# Patient Record
Sex: Female | Born: 1970 | Race: White | Hispanic: No | Marital: Married | Smoking: Never smoker
Health system: Southern US, Community
[De-identification: ages and names within clinical notes are randomized; demographics above are authoritative.]

## PROBLEM LIST (undated history)

## (undated) DIAGNOSIS — D219 Benign neoplasm of connective and other soft tissue, unspecified: Secondary | ICD-10-CM

## (undated) DIAGNOSIS — N809 Endometriosis, unspecified: Secondary | ICD-10-CM

## (undated) DIAGNOSIS — R7303 Prediabetes: Secondary | ICD-10-CM

## (undated) DIAGNOSIS — N939 Abnormal uterine and vaginal bleeding, unspecified: Secondary | ICD-10-CM

## (undated) DIAGNOSIS — N879 Dysplasia of cervix uteri, unspecified: Secondary | ICD-10-CM

## (undated) DIAGNOSIS — R1032 Left lower quadrant pain: Secondary | ICD-10-CM

## (undated) DIAGNOSIS — J189 Pneumonia, unspecified organism: Secondary | ICD-10-CM

## (undated) DIAGNOSIS — D649 Anemia, unspecified: Secondary | ICD-10-CM

## (undated) DIAGNOSIS — N838 Other noninflammatory disorders of ovary, fallopian tube and broad ligament: Secondary | ICD-10-CM

## (undated) DIAGNOSIS — C439 Malignant melanoma of skin, unspecified: Secondary | ICD-10-CM

## (undated) HISTORY — DX: Malignant melanoma of skin, unspecified: C43.9

## (undated) HISTORY — DX: Endometriosis, unspecified: N80.9

## (undated) HISTORY — DX: Anemia, unspecified: D64.9

## (undated) HISTORY — PX: TONSILLECTOMY: SUR1361

## (undated) HISTORY — PX: LAPAROSCOPY: SHX197

## (undated) HISTORY — DX: Prediabetes: R73.03

## (undated) HISTORY — DX: Benign neoplasm of connective and other soft tissue, unspecified: D21.9

## (undated) HISTORY — DX: Other noninflammatory disorders of ovary, fallopian tube and broad ligament: N83.8

## (undated) HISTORY — DX: Abnormal uterine and vaginal bleeding, unspecified: N93.9

## (undated) HISTORY — DX: Dysplasia of cervix uteri, unspecified: N87.9

## (undated) HISTORY — PX: DILATION AND CURETTAGE OF UTERUS: SHX78

## (undated) HISTORY — PX: HYSTEROSCOPY: SHX211

## (undated) HISTORY — DX: Left lower quadrant pain: R10.32

---

## 2000-03-30 HISTORY — PX: LEEP: SHX91

## 2006-06-22 ENCOUNTER — Ambulatory Visit: Payer: Self-pay | Admitting: Obstetrics and Gynecology

## 2006-07-16 ENCOUNTER — Ambulatory Visit: Payer: Self-pay | Admitting: Obstetrics and Gynecology

## 2007-01-24 ENCOUNTER — Encounter: Payer: Self-pay | Admitting: Endocrinology

## 2007-01-24 ENCOUNTER — Ambulatory Visit: Payer: Self-pay | Admitting: Obstetrics and Gynecology

## 2007-01-26 ENCOUNTER — Encounter: Payer: Self-pay | Admitting: Endocrinology

## 2007-05-10 ENCOUNTER — Ambulatory Visit: Payer: Self-pay | Admitting: Endocrinology

## 2007-05-10 DIAGNOSIS — R5381 Other malaise: Secondary | ICD-10-CM | POA: Insufficient documentation

## 2007-05-10 DIAGNOSIS — R5383 Other fatigue: Secondary | ICD-10-CM

## 2008-01-30 ENCOUNTER — Ambulatory Visit: Payer: Self-pay | Admitting: Obstetrics and Gynecology

## 2008-06-15 ENCOUNTER — Ambulatory Visit: Payer: Self-pay | Admitting: Urology

## 2009-03-13 ENCOUNTER — Ambulatory Visit: Payer: Self-pay | Admitting: Obstetrics and Gynecology

## 2009-04-05 ENCOUNTER — Ambulatory Visit: Payer: Self-pay | Admitting: Urology

## 2010-04-14 ENCOUNTER — Ambulatory Visit: Payer: Self-pay | Admitting: Obstetrics and Gynecology

## 2011-06-02 ENCOUNTER — Ambulatory Visit: Payer: Self-pay | Admitting: Obstetrics and Gynecology

## 2012-08-04 ENCOUNTER — Ambulatory Visit: Payer: Self-pay | Admitting: Obstetrics and Gynecology

## 2013-09-19 ENCOUNTER — Ambulatory Visit: Payer: Self-pay | Admitting: Physician Assistant

## 2013-09-28 ENCOUNTER — Ambulatory Visit: Payer: Self-pay | Admitting: Obstetrics and Gynecology

## 2014-03-30 HISTORY — PX: NOVASURE ABLATION: SHX5394

## 2014-04-05 DIAGNOSIS — D509 Iron deficiency anemia, unspecified: Secondary | ICD-10-CM | POA: Insufficient documentation

## 2014-04-06 ENCOUNTER — Ambulatory Visit: Payer: Self-pay | Admitting: Gastroenterology

## 2014-04-10 ENCOUNTER — Ambulatory Visit: Payer: Self-pay | Admitting: Gastroenterology

## 2014-05-03 ENCOUNTER — Ambulatory Visit: Payer: Self-pay | Admitting: Obstetrics and Gynecology

## 2014-05-03 LAB — HEMOGLOBIN: HGB: 9.5 g/dL — ABNORMAL LOW (ref 12.0–16.0)

## 2014-05-14 ENCOUNTER — Ambulatory Visit: Payer: Self-pay | Admitting: Obstetrics and Gynecology

## 2014-07-23 LAB — SURGICAL PATHOLOGY

## 2014-07-29 NOTE — Op Note (Signed)
PATIENT NAME:  Tracy, Madden MR#:  [id number] DATE OF BIRTH:  December 30, 1970  DATE OF PROCEDURE:  05/14/2014  PREOPERATIVE DIAGNOSES: 1.  Abnormal uterine bleeding.  2.  Chronic endometritis, treated.   POSTOPERATIVE DIAGNOSES:  1.  Abnormal uterine bleeding.  2.  Chronic endometritis, treated.   OPERATIVE PROCEDURE:  1.  Hysteroscopy/dilation and curettage.  2.  NovaSure endometrial ablation.   SURGEON: Malachi Paradise, MD  FIRST ASSISTANT: Elita Boone, PA-S  ANESTHESIA: General.   INDICATIONS: The patient is a 44 year old multiparous white female who presents for surgical management of abnormal uterine bleeding. Preoperative endometrial biopsy demonstrated chronic endometritis which was treated with doxycycline. She is now here for ablation.   FINDINGS AT SURGERY: Revealed gynecoid pelvis and good uterine descensus to within 2 cm of the introitus. The patient is a good transvaginal hysterectomy candidate. On hysteroscopy, the cavity was normal in appearance.   DESCRIPTION OF PROCEDURE: The patient was brought to the operating room where she was placed in the supine position. General endotracheal anesthesia was induced without difficulty. She was placed in the dorsal lithotomy position using the candy-cane stirrups. A Betadine perineal, intravaginal prep and drape was performed in standard fashion. A latex free catheter was used to drain 100 mL of urine from the bladder. A single-tooth tenaculum was placed on the anterior lip of the cervix. The uterus was sounded to 10 cm. The endocervical length was measured at 4 cm. Hegar dilators were used up to a #8 [place] in order to dilate the endocervical canal. This was followed by hysteroscopy using lactated Ringer's as irrigant to assess the intrauterine cavity. Normal findings were identified. No significant lesions were seen. Both smooth and serrated curettes were used to curettage the endometrial cavity. Repeat hysteroscopy demonstrated  no significant residual tissue. Following completion of the D and C, NovaSure ablation was performed in standard fashion. The instrument was inserted and secured. The uterine width was 4.3 cm. Cavity test was negative. This was followed by engaging the instrument with subsequent ablation being performed. Once the procedure was completed, the instrument was removed from the uterine cavity. Repeat hysteroscopy demonstrated an excellent thermal effect of the endometrial cavity. All instrumentation was then removed from the vagina and uterus. The patient was awakened, mobilized, and taken to the recovery room in satisfactory condition.   ESTIMATED BLOOD LOSS: 25 mL.   IV FLUIDS: 1,000 mL.   URINE OUTPUT: 100 mL.  COUNTS: All instrument, needle and sponge counts were verified as correct.  ____________________________ Alanda Slim. Delvon Chipps, MD mad:sb D: 05/14/2014 13:35:24 ET T: 05/14/2014 14:37:49 ET JOB#: [id number]  cc: Hassell Done A. Jacquelynne Guedes, MD, <Dictator> Alanda Slim Piedad Standiford MD ELECTRONICALLY SIGNED 05/30/2014 13:42

## 2014-08-29 ENCOUNTER — Telehealth: Payer: Self-pay | Admitting: Obstetrics and Gynecology

## 2014-08-29 NOTE — Telephone Encounter (Signed)
PT CALLED AND WAS RETURNING YOUR CALL SHE WOULD LIKE FOR YOU TO CALL HER BACK AT [phone].Marland KitchenTHANKS TINA

## 2014-08-29 NOTE — Telephone Encounter (Signed)
Left message to contact office

## 2014-09-04 MED ORDER — FLUCONAZOLE 150 MG PO TABS: 150.0000 mg | ORAL_TABLET | Freq: Once | ORAL | Status: DC

## 2014-09-04 NOTE — Telephone Encounter (Signed)
Pt. Aware of nu-swab results and medication (Diflucan) called in.

## 2014-10-16 ENCOUNTER — Other Ambulatory Visit: Payer: Self-pay

## 2014-10-16 DIAGNOSIS — R1032 Left lower quadrant pain: Secondary | ICD-10-CM

## 2014-10-17 ENCOUNTER — Ambulatory Visit: Payer: Self-pay | Admitting: Obstetrics and Gynecology

## 2014-10-17 ENCOUNTER — Ambulatory Visit (INDEPENDENT_AMBULATORY_CARE_PROVIDER_SITE_OTHER): Payer: 59 | Admitting: Obstetrics and Gynecology

## 2014-10-17 ENCOUNTER — Ambulatory Visit: Payer: 59

## 2014-10-17 ENCOUNTER — Encounter: Payer: Self-pay | Admitting: Obstetrics and Gynecology

## 2014-10-17 VITALS — BP 110/75 | HR 65 | Ht 63.0 in | Wt 132.4 lb

## 2014-10-17 DIAGNOSIS — R102 Pelvic and perineal pain unspecified side: Secondary | ICD-10-CM

## 2014-10-17 DIAGNOSIS — N879 Dysplasia of cervix uteri, unspecified: Secondary | ICD-10-CM | POA: Insufficient documentation

## 2014-10-17 DIAGNOSIS — N809 Endometriosis, unspecified: Secondary | ICD-10-CM | POA: Diagnosis not present

## 2014-10-17 DIAGNOSIS — N941 Dyspareunia: Secondary | ICD-10-CM | POA: Diagnosis not present

## 2014-10-17 DIAGNOSIS — R87619 Unspecified abnormal cytological findings in specimens from cervix uteri: Secondary | ICD-10-CM | POA: Insufficient documentation

## 2014-10-17 DIAGNOSIS — R1032 Left lower quadrant pain: Secondary | ICD-10-CM

## 2014-10-17 DIAGNOSIS — N898 Other specified noninflammatory disorders of vagina: Secondary | ICD-10-CM | POA: Diagnosis not present

## 2014-10-17 DIAGNOSIS — IMO0002 Reserved for concepts with insufficient information to code with codable children: Secondary | ICD-10-CM

## 2014-10-17 NOTE — Patient Instructions (Signed)
1.  Patient will be notified by phone of laboratory results and further management planning

## 2014-10-17 NOTE — Progress Notes (Signed)
Patient ID: Tracy Madden, female   DOB: [birth year], 44 y.o.   MRN: [id number]   2 MONTH F/U O LEFT OVARIAN CYST- SEE U/S REPORT PT IS STILL HAVING YELLOW D/C  LLQ PAIN AFTER IC- NAGGING PAIN  Chief complaint: 1.  Left lower quadrant pain. 2.  Chronic yellow vaginal discharge. 3.  History of recurrent monilial vaginitis  Patient presents today for follow-up on pelvic ultrasound  and evaluation of vaginal discharge that appears to be chronic.  This is associated with pelvic discomfort following intercourse.  She has treated herself with multiple antifungal medications, including intravaginal and oral preparations.  She is unable to clear the discharge.  Ultrasound today for evaluation of left lower quadrant pain, was notable for normal-appearing ovaries, 2 small fibroids measuring less than 2 cm, and then endometrial stripe with fluid within the endometrial cavity.  Patient is not experiencing any abnormal uterine bleeding. She does have a history of endometriosis.  OBJECTIVE: BP 110/75 mmHg  Pulse 65  Ht 5\' 3"  (1.6 m)  Wt 132 lb 6.4 oz (60.056 kg)  BMI 23.46 kg/m2 Patient is a pleasant, well-appearing white female in no acute distress. Abdomen soft, nontender, without organomegaly. Flank without CVA tenderness. Pelvic:  Vulva-normal.  , BUS-normal.  Vagina-white discharge, flaky, no odor   Cervix-normal  Uterus-normal  Adnexa-nontender, nonpalpable  Rectum-normal  PROCEDURE: Wet prep: Normal saline-negative. KOH prep-negative  IMPRESSION: 1.  Chronic left lower quadrant pain, unclear etiology, nonacute. 2.  Normal adnexa on pelvic ultrasound. 3.  Small uterine fibroids. 4.  Possible monilia vaginitis, recurrent.  PLAN: 1.  Wet prep done. 2.   Nu swab done due to inconclusive nature of wet prep 3.  Patient will wait for laboratory results before further management. 4.  Will consider weekly Diflucan 8-12 weeks .  If laboratory testing is positive for  monilia.

## 2014-10-22 LAB — NUSWAB BV AND CANDIDA, NAA
CANDIDA GLABRATA, NAA: NEGATIVE
Candida albicans, NAA: NEGATIVE

## 2014-11-06 ENCOUNTER — Telehealth: Payer: Self-pay | Admitting: Obstetrics and Gynecology

## 2014-11-06 NOTE — Telephone Encounter (Signed)
Pt called and would like a call back, she had some questions.

## 2014-11-07 ENCOUNTER — Telehealth: Payer: Self-pay | Admitting: Obstetrics and Gynecology

## 2014-11-07 MED ORDER — FLUCONAZOLE 150 MG PO TABS: 150.0000 mg | ORAL_TABLET | ORAL | Status: DC

## 2014-11-07 NOTE — Addendum Note (Signed)
Addended by: Elouise Munroe on: 11/07/2014 11:28 AM   Modules accepted: Orders

## 2014-11-07 NOTE — Telephone Encounter (Signed)
Pt would like weekly diflucan for yeast infections. Pt has tried otc which is not helping. Per mad note she may try diflucan weekly for 12 weeks. Erx. Pt advised if no better in 12 weeks she will need to be seen.

## 2014-11-07 NOTE — Telephone Encounter (Signed)
Pt called this am. See message 11/07/2014.

## 2014-11-07 NOTE — Telephone Encounter (Signed)
SHE DISCUSSED A MEDICATION WITH DR DE AT LAST VISIT AND WOULD LIKE THAT MED SENT TO PHARMACY   DIFLUCAN   [place]

## 2014-12-27 ENCOUNTER — Encounter: Payer: Self-pay | Admitting: Family

## 2014-12-27 ENCOUNTER — Ambulatory Visit: Payer: Self-pay | Admitting: Family

## 2014-12-27 VITALS — BP 92/60 | Temp 98.7°F

## 2014-12-27 DIAGNOSIS — K120 Recurrent oral aphthae: Secondary | ICD-10-CM

## 2014-12-27 NOTE — Progress Notes (Signed)
S/ 44 y/o WMF c/o painful lesion on lower lip x 6 d , mild uri sxs , denies dental pain, + stress in grad school ROS otherwise neg  O/ VSS NAD       ENT unremarkable x canker sore lower lip        Neck supple         Heart RR Lungs clear A/ canker sore  P / oragel. Saline gargles. Tylenol 1-2 tabs po q4h prn Reassurance. F/u prn.

## 2015-05-30 ENCOUNTER — Ambulatory Visit: Payer: 59 | Admitting: Obstetrics and Gynecology

## 2015-05-31 ENCOUNTER — Encounter: Payer: Self-pay | Admitting: Physician Assistant

## 2015-05-31 ENCOUNTER — Ambulatory Visit: Payer: Self-pay | Admitting: Physician Assistant

## 2015-05-31 VITALS — BP 100/72 | HR 72 | Temp 98.7°F

## 2015-05-31 DIAGNOSIS — J209 Acute bronchitis, unspecified: Secondary | ICD-10-CM

## 2015-05-31 DIAGNOSIS — B009 Herpesviral infection, unspecified: Secondary | ICD-10-CM

## 2015-05-31 MED ORDER — PSEUDOEPH-BROMPHEN-DM 30-2-10 MG/5ML PO SYRP: 5.0000 mL | ORAL_SOLUTION | Freq: Four times a day (QID) | ORAL | Status: DC | PRN

## 2015-05-31 MED ORDER — PENCICLOVIR 1 % EX CREA: 1.0000 "application " | TOPICAL_CREAM | CUTANEOUS | Status: DC

## 2015-05-31 MED ORDER — ACYCLOVIR 400 MG PO TABS: 400.0000 mg | ORAL_TABLET | Freq: Every day | ORAL | Status: DC

## 2015-05-31 NOTE — Progress Notes (Signed)
   Subjective:    Patient ID: Tracy Madden, female    DOB: 1970-10-29, 45 y.o.   MRN: [id number]  HPI Patient c/o nasal and chest congestion. States productive cough. Patient has vesicular lesion on upper lip. Patient denies fever/chill, or N/V/D. States using OTC medication for cough.   Review of Systems Fatigue and anemia    Objective:   Physical Exam Vesicular lesion upper lip, edematous nasal turbinates, post nasal drainage, non-productive cough.  Neck supple, Lungs with mild Rales, Heart RRR.       Assessment & Plan: Herpes Simplex and Bronchitis.  Acyclovir, Bromfed DM, and Acyclovir.  Follow up PRN

## 2015-06-12 ENCOUNTER — Ambulatory Visit (INDEPENDENT_AMBULATORY_CARE_PROVIDER_SITE_OTHER): Payer: 59 | Admitting: Obstetrics and Gynecology

## 2015-06-12 ENCOUNTER — Encounter: Payer: Self-pay | Admitting: Obstetrics and Gynecology

## 2015-06-12 VITALS — BP 101/63 | HR 74 | Ht 63.0 in | Wt 134.0 lb

## 2015-06-12 DIAGNOSIS — Z1239 Encounter for other screening for malignant neoplasm of breast: Secondary | ICD-10-CM | POA: Diagnosis not present

## 2015-06-12 DIAGNOSIS — N898 Other specified noninflammatory disorders of vagina: Secondary | ICD-10-CM

## 2015-06-12 DIAGNOSIS — R102 Pelvic and perineal pain: Secondary | ICD-10-CM

## 2015-06-12 DIAGNOSIS — Z124 Encounter for screening for malignant neoplasm of cervix: Secondary | ICD-10-CM | POA: Diagnosis not present

## 2015-06-12 LAB — POCT URINALYSIS DIPSTICK
Bilirubin, UA: NEGATIVE
Glucose, UA: NEGATIVE
Ketones, UA: NEGATIVE
LEUKOCYTES UA: NEGATIVE
NITRITE UA: NEGATIVE
PROTEIN UA: NEGATIVE
Spec Grav, UA: 1.025
UROBILINOGEN UA: 0.2
pH, UA: 5

## 2015-06-12 NOTE — Progress Notes (Signed)
GYN ENCOUNTER NOTE  Subjective:       Tracy Madden is a 45 y.o. G4P1  female is here for gynecologic evaluation of the following issues:  1. Lower abdominal pain 2. Watery vaginal discharge   Gynecologic History No LMP recorded. Patient has had an ablation. Contraception: surgical Last Pap: 2015. Results were: normal Last mammogram: 2015. Results were: normal  Obstetric History OB History  No data available    Past Medical History  Diagnosis Date  . Endometriosis   . Fibroid   . Abnormal uterine bleeding   . Abdominal pain, LLQ   . Paratubal cyst   . Anemia   . Prediabetes   . Cervical dysplasia   . Melanoma ([place])     RT SHOULDER    Past Surgical History  Procedure Laterality Date  . Leep  2002  . Laparoscopy      DRAINAGE AND EXCISION OF PARATUBAL CYST  . Hysteroscopy    . Dilation and curettage of uterus    . Novasure ablation  2016    Current Outpatient Prescriptions on File Prior to Visit  Medication Sig Dispense Refill  . clindamycin (CLINDAGEL) 1 % gel Apply topically.    Marland Kitchen VALERIAN ROOT PO Take by mouth.     No current facility-administered medications on file prior to visit.    Allergies  Allergen Reactions  . Aspirin   . Niacin Nausea And Vomiting  . Eggs Or Egg-Derived Products Rash    Social History   Social History  . Marital Status: Married    Spouse Name: N/A  . Number of Children: N/A  . Years of Education: N/A   Occupational History  . Not on file.   Social History Main Topics  . Smoking status: Never Smoker   . Smokeless tobacco: Not on file  . Alcohol Use: Yes     Comment: OCCAS  . Drug Use: No  . Sexual Activity: Yes    Birth Control/ Protection: Surgical   Other Topics Concern  . Not on file   Social History Narrative    Family History  Problem Relation Age of Onset  . Cancer Neg Hx     The following portions of the patient's history were reviewed and updated as appropriate: allergies, current  medications, past family history, past medical history, past social history, past surgical history and problem list.  Review of Systems Review of Systems - General ROS: negative for - chills, fatigue, fever, hot flashes, malaise or night sweats Hematological and Lymphatic ROS: negative for - bleeding problems or swollen lymph nodes Gastrointestinal ROS: negative for - abdominal pain, blood in stools, change in bowel habits and nausea/vomiting Musculoskeletal ROS: negative for - joint pain, muscle pain or muscular weakness Genito-Urinary ROS: negative for - change in menstrual cycle, dysmenorrhea, dyspareunia, dysuria,  genital ulcers, hematuria, incontinence, irregular/heavy menses, nocturia. Positive for occasional vaginal watery discharge, occasional sharp lower abdominal pain, mild lower back pain  Objective:   BP 101/63 mmHg  Pulse 74  Ht 5\' 3"  (1.6 m)  Wt 134 lb (60.782 kg)  BMI 23.74 kg/m2 CONSTITUTIONAL: Well-developed, well-nourished female in no acute distress.  HENT:  Normocephalic, atraumatic.  NECK: Normal range of motion, supple, no masses.  Normal thyroid.  SKIN: Skin is warm and dry. No rash noted. Not diaphoretic. No erythema. No pallor. [place]: Alert and oriented to person, place, and time. PSYCHIATRIC: Normal mood and affect. Normal behavior. Normal judgment and thought content. CARDIOVASCULAR:Not Examined RESPIRATORY: Not Examined BREASTS:  Not Examined ABDOMEN: Soft, non distended; Non tender.  No Organomegaly. PELVIC:  External Genitalia: Normal  BUS: Normal  Vagina: Normal; Normal-appearing secretions  Cervix: Normal; No cervical motion tenderness  Uterus: Normal size, shape,consistency, mobile. Mild pain on bimanual exam  Adnexa: Normal; mild left-sided pain on palpation; No masses  RV: Normal External exam  Bladder: Nontender MUSCULOSKELETAL: Normal range of motion. No tenderness.  No cyanosis, clubbing, or edema.  PROCEDURE: Wet  prep. KOH-negative. Normal saline-negative     Assessment:   1. Pelvic pain in female - POCT urinalysis dipstick - Urine culture 2. Screening for breast cancer - MM DIGITAL SCREENING BILATERAL; Future 3. Vaginal discharge - Wet prep: DNA; gonorrhea/chlamydia, trichomoniasis, candidiasis, bacterial vaginosis - Pap smear   Plan:   Pelvic pain likely due to established endometriosis of uterus  Discussed Lupron as treatment option, patient will contact if she decides to try it.  She will record episodes of pain and discharge on monthly calendar to bring to next appointment Return for follow-up in 6 months.    Olene Floss, PA-S [name], MD   I have seen, interviewed, and examined the patient in conjunction with the [facility].A. student and affirm the diagnosis and management plan. Toinette Lackie A. Brandace Cargle, MD, FACOG   Note: This dictation was prepared with Dragon dictation along with smaller phrase technology. Any transcriptional errors that result from this process are unintentional.

## 2015-06-14 LAB — URINE CULTURE: Organism ID, Bacteria: NO GROWTH

## 2015-06-15 LAB — NUSWAB VAGINITIS PLUS (VG+)
Candida albicans, NAA: NEGATIVE
Candida glabrata, NAA: NEGATIVE
Chlamydia trachomatis, NAA: NEGATIVE
Neisseria gonorrhoeae, NAA: NEGATIVE
Trich vag by NAA: NEGATIVE

## 2015-06-17 LAB — PAP IG AND HPV HIGH-RISK
HPV, HIGH-RISK: POSITIVE — AB
PAP Smear Comment: 0

## 2015-07-01 NOTE — Patient Instructions (Addendum)
1.  Urine culture is sent. 2.  Wet prep is completed. 3.  Return as needed if symptoms worsen. 4.  Follow-up in 6 months

## 2015-07-02 ENCOUNTER — Ambulatory Visit (INDEPENDENT_AMBULATORY_CARE_PROVIDER_SITE_OTHER): Payer: 59 | Admitting: Obstetrics and Gynecology

## 2015-07-02 ENCOUNTER — Encounter: Payer: Self-pay | Admitting: Obstetrics and Gynecology

## 2015-07-02 VITALS — BP 122/69 | HR 77 | Ht 63.0 in | Wt 133.0 lb

## 2015-07-02 DIAGNOSIS — B977 Papillomavirus as the cause of diseases classified elsewhere: Secondary | ICD-10-CM | POA: Diagnosis not present

## 2015-07-02 DIAGNOSIS — N870 Mild cervical dysplasia: Secondary | ICD-10-CM | POA: Diagnosis not present

## 2015-07-02 DIAGNOSIS — R888 Abnormal findings in other body fluids and substances: Secondary | ICD-10-CM

## 2015-07-02 DIAGNOSIS — IMO0002 Reserved for concepts with insufficient information to code with codable children: Secondary | ICD-10-CM

## 2015-07-02 NOTE — Patient Instructions (Signed)
COLPOSCOPY POST-PROCEDURE INSTRUCTIONS  1. You may take Ibuprofen, Aleve or Tylenol for cramping if needed.  2. If Monsel's solution was used, you will have a black discharge.  3. Light bleeding is normal.  If bleeding is heavier than your period, please call.  4. Put nothing in your vagina until the bleeding or discharge stops (usually 2 or3 days).  5. We will call you within one week with biopsy results or discuss the results at your follow-up appointment if needed. 6.  

## 2015-07-02 NOTE — Addendum Note (Signed)
Addended by: Elouise Munroe on: 07/02/2015 04:12 PM   Modules accepted: Orders

## 2015-07-02 NOTE — Progress Notes (Signed)
Chief complaint: 1. Positive HPV Pap smear 2. Colposcopy  Past history of cervical dysplasia Status post LEEP cone biopsy in 2002 Past history of hysteroscopy/D&C with NovaSure ablation 2016  OBJECTIVE: BP 122/69 mmHg  Pulse 77  Ht 5\' 3"  (1.6 m)  Wt 133 lb (60.328 kg)  BMI 23.57 kg/m2  COLPOSCOPY: Upper adjacent vagina and cervix with biopsies and ECC SCJ: Not visualized; cervix appears slightly windswept Lesions: Possible abnormal vessels at 3:00 Biopsies: ECC; cervical biopsy 3:00 (tenaculum was used to help obtain biopsy)  ASSESSMENT: 1. Positive HPV on Pap 2. Inadequate colposcopy 3. Possible abnormal vessel at 3  PLAN: 1. ECC 2. Cervix biopsy 3:00 3. Will notify patient of results by telephone 4. Return in 6 months for colposcopy  [name], MD  Note: This dictation was prepared with Dragon dictation along with smaller phrase technology. Any transcriptional errors that result from this process are unintentional.

## 2015-07-04 LAB — PATHOLOGY

## 2015-07-09 ENCOUNTER — Ambulatory Visit: Payer: 59 | Admitting: Obstetrics and Gynecology

## 2015-07-15 ENCOUNTER — Encounter: Payer: Self-pay | Admitting: Obstetrics and Gynecology

## 2015-09-19 DIAGNOSIS — D485 Neoplasm of uncertain behavior of skin: Secondary | ICD-10-CM | POA: Diagnosis not present

## 2015-10-17 ENCOUNTER — Telehealth: Payer: Self-pay | Admitting: Obstetrics and Gynecology

## 2015-10-17 NOTE — Telephone Encounter (Signed)
PT CALLED AND IS HAVING LEFT NIPPLE PAIN, SHE IS UNSURE IT ITS DUE TO THE HOT WEATHER OR SOMETHING ELSE, I TOLD HER THAT DR DES NEXT AVAILABLE APPT IS 8/17, SHE WENT AHEAD AND MADE AN APPT FOR THAT DAY BUT SHE IS CONCERNED AND WANTED TO KNOW IF DR DE COULD ORDER A MAMMO WITH OUT SEEING HER OR SHE SAID A HORMONE BLOOD TEST.

## 2015-10-17 NOTE — Telephone Encounter (Signed)
Pt states for the last 2 months she has constant left nipple/areola pain. NO fever or redness. NO trauma to the breast. She has tried ice and atb ointment. Nothing helps. Mammo was ordered 05/2015. Advised pt she could have mammo but may want MAD to examine first. Has an appt on 8/17. Pt also wants her hormone levels checked. Aware I will send message to mad.

## 2015-10-21 NOTE — Telephone Encounter (Signed)
Pt will have mammogram first. Pain not as bad recently. She will f/u with Mad on 8/17 for exam. At that time she will discuss referral for gen surgery. Will call me back if desires referral before then.

## 2015-11-14 ENCOUNTER — Ambulatory Visit: Payer: 59 | Admitting: Obstetrics and Gynecology

## 2015-12-06 ENCOUNTER — Ambulatory Visit: Payer: Self-pay | Admitting: Physician Assistant

## 2015-12-16 ENCOUNTER — Other Ambulatory Visit: Payer: Self-pay | Admitting: Obstetrics and Gynecology

## 2015-12-25 ENCOUNTER — Ambulatory Visit: Payer: Self-pay

## 2016-01-01 ENCOUNTER — Ambulatory Visit: Payer: 59 | Admitting: Obstetrics and Gynecology

## 2016-01-09 ENCOUNTER — Encounter: Payer: Self-pay | Admitting: Obstetrics and Gynecology

## 2016-01-09 ENCOUNTER — Other Ambulatory Visit: Payer: Self-pay | Admitting: Obstetrics and Gynecology

## 2016-01-09 ENCOUNTER — Ambulatory Visit (INDEPENDENT_AMBULATORY_CARE_PROVIDER_SITE_OTHER): Payer: 59 | Admitting: Obstetrics and Gynecology

## 2016-01-09 ENCOUNTER — Ambulatory Visit
Admission: RE | Admit: 2016-01-09 | Discharge: 2016-01-09 | Disposition: A | Discharge status: Home / Self Care | Payer: 59 | Source: Ambulatory Visit | Attending: Obstetrics and Gynecology | Admitting: Obstetrics and Gynecology

## 2016-01-09 VITALS — BP 118/68 | HR 72 | Ht 63.0 in | Wt 137.3 lb

## 2016-01-09 DIAGNOSIS — B977 Papillomavirus as the cause of diseases classified elsewhere: Secondary | ICD-10-CM | POA: Diagnosis not present

## 2016-01-09 DIAGNOSIS — Z1239 Encounter for other screening for malignant neoplasm of breast: Secondary | ICD-10-CM

## 2016-01-09 DIAGNOSIS — N870 Mild cervical dysplasia: Secondary | ICD-10-CM

## 2016-01-09 DIAGNOSIS — Z1231 Encounter for screening mammogram for malignant neoplasm of breast: Secondary | ICD-10-CM | POA: Insufficient documentation

## 2016-01-09 MED ORDER — NITROFURANTOIN MONOHYD MACRO 100 MG PO CAPS: 100.0000 mg | ORAL_CAPSULE | Freq: Every day | ORAL | 6 refills | Status: DC | PRN

## 2016-01-09 MED ORDER — CLINDAMYCIN PHOSPHATE 1 % EX GEL: 1.0000 "application " | Freq: Two times a day (BID) | CUTANEOUS | 3 refills | Status: DC

## 2016-01-09 NOTE — Patient Instructions (Signed)
1. Return in November for colposcopy 2. LEEP cone biopsy is scheduled for December 18  Loop Electrosurgical Excision Procedure Loop electrosurgical excision procedure (LEEP) is the removal of a portion of the lower part of the uterus (cervix). The procedure is done when there are significantly abnormal cervical cell changes. Abnormal cell changes of the cervix can lead to cancer if left in place and untreated.  The LEEP procedure itself typically only takes a few minutes. Often, it may be done in your caregiver's office. The procedure is considered safe for those who wish to get pregnant or are trying to get pregnant. Only under rare circumstances should this procedure be done if you are pregnant. LET YOUR CAREGIVER KNOW ABOUT:  Whether you are pregnant or late for your last menstrual period.  Allergies to foods or medicines.  All the medicines you are taking includingherbs, eyedrops, and over-the-counter medicines, and creams.  Use of steroids (by mouth or creams).  Previous problems with anesthetics or numbing medicine.  Previous gynecological surgery.  History of blood clots or bleeding problems.  Any recent or current vaginal infections (herpes, sexually transmitted infections).  Other health problems. RISKS AND COMPLICATIONS  Bleeding.  Infection.  Injury to the vagina, bladder, or rectum.  Very rare obstruction of the cervical opening that causes problems during menstruation (cervical stenosis). BEFORE THE PROCEDURE  Do not take aspirin or blood thinners (anticoagulants) for 1 week before the procedure, or as told by your caregiver.  Eat a light meal before the procedure.  Ask your caregiver about changing or stopping your regular medicines.  You may be given a pain reliever 1 or 2 hours before the procedure. PROCEDURE   A tool (speculum) is placed in the vagina. This allows your caregiver to see the cervix.  An iodine stain is applied to the cervix to find the  area of abnormal cells to be removed.  Medicine is injected to numb the cervix (local anesthetic).   Electricity is passed through a thin wire loop which is then used to remove (cauterize) a small segment of the affected cervix.  Light electrocautery is used to seal any small blood vessels and prevent bleeding.  A paste may be applied to the cauterized area of the cervix to help prevent bleeding.  The tissue sample is sent to the lab. It is examined under the microscope. AFTER THE PROCEDURE  Have someone drive you home.  You may have slight to moderate cramping.  You may notice a black vaginal discharge from the paste used on the cervix to prevent bleeding. This is normal.  Watch for excessive bleeding. This requires immediate medical care.  Ask when your test results will be ready. Make sure you get your test results.   This information is not intended to replace advice given to you by your health care provider. Make sure you discuss any questions you have with your health care provider.   Document Released: 06/06/2002 Document Revised: 06/08/2011 Document Reviewed: 08/26/2010 Elsevier Interactive Patient Education [employer].

## 2016-01-09 NOTE — Progress Notes (Signed)
Chief complaint: 1. 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 Pros and cons of this aggressive management plan were reviewed.  Past medical history, past surgical history, problem list, medications, and allergies are reviewed  OBJECTIVE: BP 118/68   Pulse 72   Ht 5\' 3"  (1.6 m)   Wt 137 lb 4.8 oz (62.3 kg)   BMI 24.32 kg/m  Physical exam-deferred  ASSESSMENT: 1. 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 Desires surgical intervention  PLAN: 1. Colposcopy mid-November 2017 2. LEEP cone biopsy of cervix minutes December 2017  A total of 15 minutes were spent face-to-face with the patient during this encounter and over half of that time dealt with counseling and coordination of care.  [name], MD  Note: This dictation was prepared with Dragon dictation along with smaller phrase technology. Any transcriptional errors that result from this process are unintentional.

## 2016-01-22 DIAGNOSIS — H5213 Myopia, bilateral: Secondary | ICD-10-CM | POA: Diagnosis not present

## 2016-02-11 ENCOUNTER — Encounter: Payer: Self-pay | Admitting: Obstetrics and Gynecology

## 2016-02-11 ENCOUNTER — Ambulatory Visit (INDEPENDENT_AMBULATORY_CARE_PROVIDER_SITE_OTHER): Payer: 59 | Admitting: Obstetrics and Gynecology

## 2016-02-11 VITALS — BP 103/68 | HR 64 | Ht 63.0 in | Wt 139.7 lb

## 2016-02-11 DIAGNOSIS — N870 Mild cervical dysplasia: Secondary | ICD-10-CM | POA: Diagnosis not present

## 2016-02-11 DIAGNOSIS — N9089 Other specified noninflammatory disorders of vulva and perineum: Secondary | ICD-10-CM | POA: Diagnosis not present

## 2016-02-11 NOTE — Progress Notes (Addendum)
Chief complaint: 1. History of CIN-1 of cervix 2. Colposcopy   Past history of cervical dysplasia Status post LEEP cone biopsy in 2002 Past history of hysteroscopy/D&C with NovaSure ablation 2016 07/02/2015 colposcopy inadequate; abnormal vessel at 3:00; biopsy consistent with CIN-1    OBJECTIVE: BP 103/68   Pulse 64   Ht 5\' 3"  (1.6 m)   Wt 139 lb 11.2 oz (63.4 kg)   BMI 24.75 kg/m   PROCEDURE: Colposcopy of cervix and upper vagina  Indications: History of CIN-1 of the cervix; status post LEEP cone biopsy  Findings: Inadequate colposcopy (SCJ not visualized); 4 mm circular blood vessel at 9:00; cervix with wind swept change  ASSESSMENT: 1. History of CIN-1 2. Patient desires definitive surgery for this chronic condition 3. Inadequate colposcopy today with evidence of an abnormal vessel at 9:00-4 mm circular blood vessel 4. History of LEEP cone biopsy 2002  PLAN: 1. LEEP cone biopsy of cervix in mid December 2017 2. Return for preoperative appointment prior surgery  [name], MD  Note: This dictation was prepared with Dragon dictation along with smaller phrase technology. Any transcriptional errors that result from this process are unintentional.   ADDENDUM: Patient has left labia majora raised lesion 4 mm, subtle, desires removal Will proceed with punch biopsy of left labia majora lesion at the time of LEEP cone biopsy.

## 2016-02-11 NOTE — Patient Instructions (Signed)
1. LEEP cone biopsy of the cervix was scheduled for mid December 2017 2. Return for preop appointment 1 week before surgery

## 2016-02-17 ENCOUNTER — Encounter: Payer: Self-pay | Admitting: Obstetrics and Gynecology

## 2016-03-11 ENCOUNTER — Ambulatory Visit (INDEPENDENT_AMBULATORY_CARE_PROVIDER_SITE_OTHER): Payer: 59 | Admitting: Obstetrics and Gynecology

## 2016-03-11 ENCOUNTER — Encounter: Payer: Self-pay | Admitting: Obstetrics and Gynecology

## 2016-03-11 VITALS — BP 112/71 | HR 89 | Ht 63.0 in | Wt 138.6 lb

## 2016-03-11 DIAGNOSIS — N870 Mild cervical dysplasia: Secondary | ICD-10-CM

## 2016-03-11 DIAGNOSIS — N9089 Other specified noninflammatory disorders of vulva and perineum: Secondary | ICD-10-CM

## 2016-03-11 DIAGNOSIS — Z01818 Encounter for other preprocedural examination: Secondary | ICD-10-CM

## 2016-03-11 NOTE — Patient Instructions (Signed)
1. Return on 04/08/2016 for postop check

## 2016-03-11 NOTE — H&P (Signed)
Subjective:  PREOPERATIVE HISTORY AND PHYSICAL    Date of surgery: 03/16/2016 Procedure: LEEP cone biopsy of cervix; vulvar biopsy Diagnoses: 1. CIN-1 of cervix 2. Vulvar lesion   Patient is a 45 y.o. No obstetric history on file.female scheduled for LEEP cone blot biopsy of the cervix and vulvar biopsy of atypical lesion. History of cervical dysplasia in past. Status post LEEP cone biopsy in 2002 Status post hysteroscopy/D&C with NovaSure endometrial ablation in 2016 07/02/2015 colposcopy was inadequate; abnormal vessel at 3:00; biopsy consistent with CIN-1 02/11/2016 colposcopy was inadequate; 4 mm circular blood vessel at 9:00 noted, cervix with wind swept change. Patient has left labia majora lesion, chronic for which biopsies desired. 3.5 mm punch biopsy will be performed.  Menstrual History: OB History    No data available     No LMP recorded. Patient has had an ablation.    Past Medical History:  Diagnosis Date  . Abdominal pain, LLQ   . Abnormal uterine bleeding   . Anemia   . Cervical dysplasia   . Endometriosis   . Fibroid   . Melanoma ([place])    RT SHOULDER  . Paratubal cyst   . Prediabetes     Past Surgical History:  Procedure Laterality Date  . DILATION AND CURETTAGE OF UTERUS    . HYSTEROSCOPY    . LAPAROSCOPY     DRAINAGE AND EXCISION OF PARATUBAL CYST  . LEEP  2002  . NOVASURE ABLATION  2016    OB History  No data available    Social History   Social History  . Marital status: Married    Spouse name: N/A  . Number of children: N/A  . Years of education: N/A   Social History Main Topics  . Smoking status: Never Smoker  . Smokeless tobacco: Never Used  . Alcohol use Yes     Comment: OCCAS  . Drug use: No  . Sexual activity: Yes    Birth control/ protection: Surgical   Other Topics Concern  . None   Social History Narrative  . None    Family History  Problem Relation Age of Onset  . Cancer Neg Hx      (Not in a hospital  admission)  Allergies  Allergen Reactions  . Aspirin Shortness Of Breath    Inhale aspirin, no reaction to oral aspirin   . Eggs Or Egg-Derived Products Anaphylaxis  . Niacin Hives and Rash    Review of Systems Constitutional: No recent fever/chills/sweats Respiratory: No recent cough/bronchitis Cardiovascular: No chest pain Gastrointestinal: No recent nausea/vomiting/diarrhea Genitourinary: No UTI symptoms Hematologic/lymphatic:No history of coagulopathy or recent blood thinner use    Objective:    BP 112/71   Pulse 89   Ht 5\' 3"  (1.6 m)   Wt 138 lb 9.6 oz (62.9 kg)   BMI 24.55 kg/m   General:   Normal  Skin:   normal  HEENT:  Normal  Neck:  Supple without Adenopathy or Thyromegaly  Lungs:   Heart:              Breasts:   Abdomen:  Pelvis:  M/S   Extremeties:  Neuro:    clear to auscultation bilaterally   Normal without murmur   Not Examined   soft, non-tender; bowel sounds normal; no masses,  no organomegaly   Exam deferred to OR  No CVAT  Warm/Dry   Normal          Assessment:    1. Chronic persistent cervical dysplasia-CIN-1  2. History of prior LEEP cone biopsy 2002 3. Vulvar lesion left labia majora   Plan:  LEEP cone biopsy of the cervix Vulvar biopsy-3.5 mm punch  Preop counseling: Patient is to undergo LEEP cone biopsy of the cervix on 03/16/2016. She also will have a vulvar biopsy at the same time. She is understanding of the planned procedures and is aware of and is accepting of all surgical risks which include but are not limited to bleeding, infection, pelvic organ injury with need for repair, blood clot disorders, anesthesia risks, etc. All questions have been answered. Informed consent is given. Patient is ready and willing to proceed with surgery as scheduled.  [name], MD  Note: This dictation was prepared with Dragon dictation along with smaller phrase technology. Any transcriptional errors that result from this process  are unintentional.

## 2016-03-11 NOTE — Progress Notes (Signed)
Subjective:  PREOPERATIVE HISTORY AND PHYSICAL    Date of surgery: 03/16/2016 Procedure: LEEP cone biopsy of cervix; vulvar biopsy Diagnoses: 1. CIN-1 of cervix 2. Vulvar lesion   Patient is a 45 y.o. No obstetric history on file.female scheduled for LEEP cone blot biopsy of the cervix and vulvar biopsy of atypical lesion. History of cervical dysplasia in past. Status post LEEP cone biopsy in 2002 Status post hysteroscopy/D&C with NovaSure endometrial ablation in 2016 07/02/2015 colposcopy was inadequate; abnormal vessel at 3:00; biopsy consistent with CIN-1 02/11/2016 colposcopy was inadequate; 4 mm circular blood vessel at 9:00 noted, cervix with wind swept change. Patient has left labia majora lesion, chronic for which biopsies desired. 3.5 mm punch biopsy will be performed.  Menstrual History: OB History    No data available     No LMP recorded. Patient has had an ablation.    Past Medical History:  Diagnosis Date  . Abdominal pain, LLQ   . Abnormal uterine bleeding   . Anemia   . Cervical dysplasia   . Endometriosis   . Fibroid   . Melanoma ([place])    RT SHOULDER  . Paratubal cyst   . Prediabetes     Past Surgical History:  Procedure Laterality Date  . DILATION AND CURETTAGE OF UTERUS    . HYSTEROSCOPY    . LAPAROSCOPY     DRAINAGE AND EXCISION OF PARATUBAL CYST  . LEEP  2002  . NOVASURE ABLATION  2016    OB History  No data available    Social History   Social History  . Marital status: Married    Spouse name: N/A  . Number of children: N/A  . Years of education: N/A   Social History Main Topics  . Smoking status: Never Smoker  . Smokeless tobacco: Never Used  . Alcohol use Yes     Comment: OCCAS  . Drug use: No  . Sexual activity: Yes    Birth control/ protection: Surgical   Other Topics Concern  . None   Social History Narrative  . None    Family History  Problem Relation Age of Onset  . Cancer Neg Hx      (Not in a hospital  admission)  Allergies  Allergen Reactions  . Aspirin Shortness Of Breath    Inhale aspirin, no reaction to oral aspirin   . Eggs Or Egg-Derived Products Anaphylaxis  . Niacin Hives and Rash    Review of Systems Constitutional: No recent fever/chills/sweats Respiratory: No recent cough/bronchitis Cardiovascular: No chest pain Gastrointestinal: No recent nausea/vomiting/diarrhea Genitourinary: No UTI symptoms Hematologic/lymphatic:No history of coagulopathy or recent blood thinner use    Objective:    BP 112/71   Pulse 89   Ht 5\' 3"  (1.6 m)   Wt 138 lb 9.6 oz (62.9 kg)   BMI 24.55 kg/m   General:   Normal  Skin:   normal  HEENT:  Normal  Neck:  Supple without Adenopathy or Thyromegaly  Lungs:   Heart:              Breasts:   Abdomen:  Pelvis:  M/S   Extremeties:  Neuro:    clear to auscultation bilaterally   Normal without murmur   Not Examined   soft, non-tender; bowel sounds normal; no masses,  no organomegaly   Exam deferred to OR  No CVAT  Warm/Dry   Normal          Assessment:    1. Chronic persistent cervical dysplasia-CIN-1  2. History of prior LEEP cone biopsy 2002 3. Vulvar lesion left labia majora   Plan:  LEEP cone biopsy of the cervix Vulvar biopsy-3.5 mm punch  Preop counseling: Patient is to undergo LEEP cone biopsy of the cervix on 03/16/2016. She also will have a vulvar biopsy at the same time. She is understanding of the planned procedures and is aware of and is accepting of all surgical risks which include but are not limited to bleeding, infection, pelvic organ injury with need for repair, blood clot disorders, anesthesia risks, etc. All questions have been answered. Informed consent is given. Patient is ready and willing to proceed with surgery as scheduled.  [name], MD  Note: This dictation was prepared with Dragon dictation along with smaller phrase technology. Any transcriptional errors that result from this process  are unintentional.

## 2016-03-12 ENCOUNTER — Encounter: Payer: 59 | Admitting: Obstetrics and Gynecology

## 2016-03-12 ENCOUNTER — Encounter
Admission: RE | Admit: 2016-03-12 | Discharge: 2016-03-12 | Disposition: A | Discharge status: Home / Self Care | Payer: 59 | Source: Ambulatory Visit | Attending: Obstetrics and Gynecology | Admitting: Obstetrics and Gynecology

## 2016-03-12 DIAGNOSIS — Z01818 Encounter for other preprocedural examination: Secondary | ICD-10-CM | POA: Insufficient documentation

## 2016-03-12 LAB — CBC WITH DIFFERENTIAL/PLATELET
BASOS PCT: 0 %
Basophils Absolute: 0 10*3/uL (ref 0–0.1)
EOS ABS: 0.1 10*3/uL (ref 0–0.7)
Eosinophils Relative: 1 %
HCT: 40.4 % (ref 35.0–47.0)
Hemoglobin: 13.7 g/dL (ref 12.0–16.0)
LYMPHS ABS: 1.8 10*3/uL (ref 1.0–3.6)
Lymphocytes Relative: 30 %
MCH: 29.2 pg (ref 26.0–34.0)
MCHC: 34 g/dL (ref 32.0–36.0)
MCV: 86.1 fL (ref 80.0–100.0)
Monocytes Absolute: 0.4 10*3/uL (ref 0.2–0.9)
Monocytes Relative: 6 %
NEUTROS ABS: 3.7 10*3/uL (ref 1.4–6.5)
NEUTROS PCT: 63 %
Platelets: 287 10*3/uL (ref 150–440)
RBC: 4.69 MIL/uL (ref 3.80–5.20)
RDW: 13.3 % (ref 11.5–14.5)
WBC: 6 10*3/uL (ref 3.6–11.0)

## 2016-03-12 LAB — RAPID HIV SCREEN (HIV 1/2 AB+AG)
HIV 1/2 ANTIBODIES: NONREACTIVE
HIV-1 P24 ANTIGEN - HIV24: NONREACTIVE

## 2016-03-12 NOTE — Patient Instructions (Signed)
Your procedure is scheduled on: Monday 03/16/16 Report to [facility]. 2ND FLOOR MEDICAL MALL ENTRANCE. To find out your arrival time please call [phone] between 1PM - 3PM on Friday 12/15/147.  Remember: Instructions that are not followed completely may result in serious medical risk, up to and including death, or upon the discretion of your surgeon and anesthesiologist your surgery may need to be rescheduled.    __X__ 1. Do not eat food or drink liquids after midnight. No gum chewing or hard candies.     __X__ 2. No Alcohol for 24 hours before or after surgery.   ____ 3. Bring all medications with you on the day of surgery if instructed.    __X__ 4. Notify your doctor if there is any change in your medical condition     (cold, fever, infections).             ___X__5. No smoking within 24 hours of your surgery.     Do not wear jewelry, make-up, hairpins, clips or nail polish.  Do not wear lotions, powders, or perfumes.   Do not shave 48 hours prior to surgery. Men may shave face and neck.  Do not bring valuables to the hospital.    [facility] is not responsible for any belongings or valuables.               Contacts, dentures or bridgework may not be worn into surgery.  Leave your suitcase in the car. After surgery it may be brought to your room.  For patients admitted to the hospital, discharge time is determined by your                treatment team.   Patients discharged the day of surgery will not be allowed to drive home.   Please read over the following fact sheets that you were given:   Pain Booklet and MRSA Information   ____ Take these medicines the morning of surgery with A SIP OF WATER:    1. NONE  2.   3.   4.  5.  6.  ____ Fleet Enema (as directed)   ____ Use CHG Soap as directed  ____ Use inhalers on the day of surgery  ____ Stop metformin 2 days prior to surgery    ____ Take 1/2 of usual insulin dose the night before surgery and none on the  morning of surgery.   ____ Stop Coumadin/Plavix/aspirin on   __X__ Stop Anti-inflammatories such as Advil, Aleve, Ibuprofen, Motrin, Naproxen, Naprosyn, Goodies,powder, or aspirin products.  OK to take Tylenol.   ____ Stop supplements until after surgery.    ____ Bring C-Pap to the hospital.

## 2016-03-13 LAB — RPR: RPR: NONREACTIVE

## 2016-03-14 LAB — TYPE AND SCREEN
ABO/RH(D): B POS
ANTIBODY SCREEN: NEGATIVE

## 2016-03-16 ENCOUNTER — Ambulatory Visit: Payer: 59 | Admitting: Anesthesiology

## 2016-03-16 ENCOUNTER — Ambulatory Visit
Admission: RE | Admit: 2016-03-16 | Discharge: 2016-03-16 | Disposition: A | Discharge status: Home / Self Care | Payer: 59 | Source: Ambulatory Visit | Attending: Obstetrics and Gynecology | Admitting: Obstetrics and Gynecology

## 2016-03-16 ENCOUNTER — Encounter
Admission: RE | Disposition: A | Discharge status: Home / Self Care | Payer: Self-pay | Source: Ambulatory Visit | Attending: Obstetrics and Gynecology

## 2016-03-16 ENCOUNTER — Encounter: Payer: Self-pay | Admitting: *Deleted

## 2016-03-16 DIAGNOSIS — N900 Mild vulvar dysplasia: Secondary | ICD-10-CM | POA: Insufficient documentation

## 2016-03-16 DIAGNOSIS — Z9889 Other specified postprocedural states: Secondary | ICD-10-CM

## 2016-03-16 DIAGNOSIS — N870 Mild cervical dysplasia: Secondary | ICD-10-CM | POA: Diagnosis not present

## 2016-03-16 HISTORY — PX: LEEP: SHX91

## 2016-03-16 LAB — ABO/RH: ABO/RH(D): B POS

## 2016-03-16 LAB — POCT PREGNANCY, URINE: Preg Test, Ur: NEGATIVE

## 2016-03-16 LAB — GLUCOSE, CAPILLARY: GLUCOSE-CAPILLARY: 84 mg/dL (ref 65–99)

## 2016-03-16 SURGERY — LEEP (LOOP ELECTROSURGICAL EXCISION PROCEDURE)
Anesthesia: General | Wound class: Clean Contaminated

## 2016-03-16 MED ORDER — GLYCOPYRROLATE 0.2 MG/ML IJ SOLN
INTRAMUSCULAR | Status: DC | PRN
  Administered 2016-03-16: 0.2 mg via INTRAVENOUS

## 2016-03-16 MED ORDER — FENTANYL CITRATE (PF) 100 MCG/2ML IJ SOLN: 25.0000 ug | INTRAMUSCULAR | Status: DC | PRN

## 2016-03-16 MED ORDER — DEXAMETHASONE SODIUM PHOSPHATE 10 MG/ML IJ SOLN
INTRAMUSCULAR | Status: DC | PRN
  Administered 2016-03-16: 4 mg via INTRAVENOUS

## 2016-03-16 MED ORDER — LACTATED RINGERS IV SOLN
INTRAVENOUS | Status: DC
  Administered 2016-03-16: 10:00:00 via INTRAVENOUS

## 2016-03-16 MED ORDER — IODINE STRONG (LUGOLS) 5 % PO SOLN
ORAL | Status: AC
  Filled 2016-03-16: qty 1

## 2016-03-16 MED ORDER — ONDANSETRON HCL 4 MG/2ML IJ SOLN
INTRAMUSCULAR | Status: DC | PRN
  Administered 2016-03-16: 4 mg via INTRAVENOUS

## 2016-03-16 MED ORDER — IODINE STRONG (LUGOLS) 5 % PO SOLN
ORAL | Status: DC | PRN
  Administered 2016-03-16: 0.1 mL

## 2016-03-16 MED ORDER — PHENYLEPHRINE HCL 10 MG/ML IJ SOLN
INTRAMUSCULAR | Status: DC | PRN
  Administered 2016-03-16: 200 ug via INTRAVENOUS

## 2016-03-16 MED ORDER — ONDANSETRON HCL 4 MG/2ML IJ SOLN: 4.0000 mg | Freq: Once | INTRAMUSCULAR | Status: DC | PRN

## 2016-03-16 MED ORDER — LACTATED RINGERS IV SOLN
INTRAVENOUS | Status: DC | PRN
Start: 2016-03-16 — End: 2016-03-16
  Administered 2016-03-16: 11:00:00 via INTRAVENOUS

## 2016-03-16 MED ORDER — FENTANYL CITRATE (PF) 100 MCG/2ML IJ SOLN
INTRAMUSCULAR | Status: DC | PRN
  Administered 2016-03-16: 50 ug via INTRAVENOUS

## 2016-03-16 MED ORDER — LIDOCAINE-EPINEPHRINE 1 %-1:100000 IJ SOLN
INTRAMUSCULAR | Status: AC
  Filled 2016-03-16: qty 1

## 2016-03-16 MED ORDER — FAMOTIDINE 20 MG PO TABS
20.0000 mg | ORAL_TABLET | Freq: Once | ORAL | Status: AC
  Administered 2016-03-16: 20 mg via ORAL

## 2016-03-16 MED ORDER — LIDOCAINE HCL (CARDIAC) 20 MG/ML IV SOLN
INTRAVENOUS | Status: DC | PRN
  Administered 2016-03-16: 60 mg via INTRAVENOUS

## 2016-03-16 MED ORDER — PROPOFOL 10 MG/ML IV BOLUS
INTRAVENOUS | Status: DC | PRN
  Administered 2016-03-16: 150 mg via INTRAVENOUS

## 2016-03-16 MED ORDER — MIDAZOLAM HCL 2 MG/2ML IJ SOLN
INTRAMUSCULAR | Status: DC | PRN
  Administered 2016-03-16: 1 mg via INTRAVENOUS

## 2016-03-16 MED ORDER — LIDOCAINE-EPINEPHRINE 1 %-1:100000 IJ SOLN
INTRAMUSCULAR | Status: DC | PRN
  Administered 2016-03-16: 13 mL

## 2016-03-16 MED ORDER — OXYCODONE-ACETAMINOPHEN 5-325 MG PO TABS: 1.0000 | ORAL_TABLET | ORAL | 0 refills | Status: DC | PRN

## 2016-03-16 MED ORDER — FAMOTIDINE 20 MG PO TABS
ORAL_TABLET | ORAL | Status: AC
  Filled 2016-03-16: qty 1

## 2016-03-16 MED ORDER — FERRIC SUBSULFATE 259 MG/GM EX SOLN
CUTANEOUS | Status: AC
  Filled 2016-03-16: qty 8

## 2016-03-16 SURGICAL SUPPLY — 35 items
APPLICATOR COTTON TIP 6IN STRL (MISCELLANEOUS) ×2 IMPLANT
APPLICATOR SWAB PROCTO LG 16IN (MISCELLANEOUS) ×2 IMPLANT
CANISTER SUCT 1200ML W/VALVE (MISCELLANEOUS) ×2 IMPLANT
CATH ROBINSON RED A/P 16FR (CATHETERS) ×2 IMPLANT
DEPRESSOR TONGUE BLADE STERILE (MISCELLANEOUS) ×2 IMPLANT
DRAPE UNDER BUTTOCK W/FLU (DRAPES) ×2 IMPLANT
DRSG TELFA 3X8 NADH (GAUZE/BANDAGES/DRESSINGS) ×2 IMPLANT
ELECT LEEP BALL 5MM 12CM (MISCELLANEOUS) ×2
ELECT LEEP LOOP 20X10 R2010 (MISCELLANEOUS) ×2
ELECT LOOP 1.0X1.0CM R1010 (MISCELLANEOUS) ×2
ELECT REM PT RETURN 9FT ADLT (ELECTROSURGICAL) ×2
ELECTRODE LEEP BALL 5MM 12CM (MISCELLANEOUS) ×1 IMPLANT
ELECTRODE LEP LOOP 20X10 R2010 (MISCELLANEOUS) ×1 IMPLANT
ELECTRODE LOOP 1.0X1.0CM R1010 (MISCELLANEOUS) ×1 IMPLANT
ELECTRODE REM PT RTRN 9FT ADLT (ELECTROSURGICAL) ×1 IMPLANT
GLOVE BIO SURGEON STRL SZ8 (GLOVE) ×2 IMPLANT
GOWN STRL REUS W/ TWL LRG LVL3 (GOWN DISPOSABLE) ×2 IMPLANT
GOWN STRL REUS W/ TWL XL LVL3 (GOWN DISPOSABLE) ×1 IMPLANT
GOWN STRL REUS W/TWL LRG LVL3 (GOWN DISPOSABLE) ×2
GOWN STRL REUS W/TWL XL LVL3 (GOWN DISPOSABLE) ×1
HANDLE YANKAUER SUCT BULB TIP (MISCELLANEOUS) ×2 IMPLANT
KIT RM TURNOVER CYSTO AR (KITS) ×2 IMPLANT
LABEL OR SOLS (LABEL) ×2 IMPLANT
NEEDLE SPNL 22GX3.5 QUINCKE BK (NEEDLE) ×2 IMPLANT
PACK DNC HYST (MISCELLANEOUS) ×2 IMPLANT
PAD OB MATERNITY 4.3X12.25 (PERSONAL CARE ITEMS) ×2 IMPLANT
PAD PREP 24X41 OB/GYN DISP (PERSONAL CARE ITEMS) ×2 IMPLANT
PENCIL ELECTRO HAND CTR (MISCELLANEOUS) ×2 IMPLANT
PUNCH SURGICAL ROTATE 2.7MM (MISCELLANEOUS) ×2 IMPLANT
SOL PREP PVP 2OZ (MISCELLANEOUS) ×2
SOLUTION PREP PVP 2OZ (MISCELLANEOUS) ×1 IMPLANT
STRAW SMOKE EVAC LEEP 6150 NON (MISCELLANEOUS) ×2 IMPLANT
SUT SILK 2 0 SH (SUTURE) ×2 IMPLANT
SYR CONTROL 10ML (SYRINGE) ×2 IMPLANT
TOWEL OR 17X26 4PK STRL BLUE (TOWEL DISPOSABLE) ×2 IMPLANT

## 2016-03-16 NOTE — Interval H&P Note (Signed)
History and Physical Interval Note:  03/16/2016 10:30 AM  Tracy Madden  has presented today for surgery, with the diagnosis of CIN 1  The various methods of treatment have been discussed with the patient and family. After consideration of risks, benefits and other options for treatment, the patient has consented to  Procedure(s): LOOP ELECTROSURGICAL EXCISION PROCEDURE (LEEP) (N/A) and VULVAR BIOPSY as a surgical intervention .  The patient's history has been reviewed, patient examined, no change in status, stable for surgery.  I have reviewed the patient's chart and labs.  Questions were answered to the patient's satisfaction.     Hassell Done A Brockton Mckesson

## 2016-03-16 NOTE — Anesthesia Preprocedure Evaluation (Signed)
Anesthesia Evaluation  Patient identified by MRN, date of birth, ID band Patient awake    Reviewed: Allergy & Precautions, NPO status , Patient's Chart, lab work & pertinent test results, reviewed documented beta blocker date and time   Airway Mallampati: II  TM Distance: >3 FB     Dental  (+) Chipped   Pulmonary           Cardiovascular      Neuro/Psych    GI/Hepatic   Endo/Other    Renal/GU      Musculoskeletal   Abdominal   Peds  Hematology  (+) anemia ,   Anesthesia Other Findings   Reproductive/Obstetrics                             Anesthesia Physical Anesthesia Plan  ASA: II  Anesthesia Plan: General   Post-op Pain Management:    Induction: Intravenous  Airway Management Planned: Oral ETT  Additional Equipment:   Intra-op Plan:   Post-operative Plan:   Informed Consent: I have reviewed the patients History and Physical, chart, labs and discussed the procedure including the risks, benefits and alternatives for the proposed anesthesia with the patient or authorized representative who has indicated his/her understanding and acceptance.     Plan Discussed with: CRNA  Anesthesia Plan Comments:         Anesthesia Quick Evaluation

## 2016-03-16 NOTE — Op Note (Signed)
OPERATIVE NOTE:  Tracy Madden PROCEDURE DATE: 03/16/2016   PREOPERATIVE DIAGNOSIS:   1. CIN-1 of cervix, recurrent 2. Vulvar lesion, left labia majora  POSTOPERATIVE DIA 1. CIN-1 OF CERVIX, RECURRENT 2. VULVAR LESION, LEFT LABIA MAJORA   PROCEDURE:  1. LEEP cone biopsy of cervix 2. Vulvar biopsy, left labia majora, 3.5 mm punch SURGEON:  [name], MD ASSISTANTS: None ANESTHESIA: General INDICATIONS: 45 y.o. No obstetric history on filehistory of recurrent CIN-1 of the cervix. History of prior LEEP cone biopsy. Patient also has 5 mm left labia majora lesion, slightly elevated and annular; desires removal  FINDINGS: 1. Normal staining of the vagina and cervix with Lugol's solution 2. A 5 mm left labia majora slightly raised annular lesion   I/O's: Total I/O In: -  Out: 200 [Urine:200] COUNTS:  YES SPECIMENS: 1. Ectocervical LEEP cone biopsy 2. Endocervical LEEP cone biopsy 3. Post LEEP ECC 4. 3.5 mm punch biopsy, left labia majora ANTIBIOTIC PROPHYLAXIS:N/A COMPLICATIONS: None immediate  PROCEDURE IN DETAPATIENT WAS BROUGHT TO THE OPERATING ROOM PLACED IN THE SUPINE POSITION. GENERAL ANESTHESIA WITH THE LMA TECHNIQUE WAS INDUCED WITHOUT DIFFICULTY. PATIENT WAS PLACED IN THE DORSAL LITHOTOMY POSITION USING CANDYCANE STIRRUPS. A CHLORAPREP PERINEAL PREP AND DRAPE WAS PERFORMED IN STANDARD FASHION. TIMEOUT WAS COMPLETED. THE BLADDER WAS DRAINED OF 200 CC OF URINE WITH A RED ROBINSON CATHETER. A coated speculum was placed in the vagina. The vagina and cervix were painted with Lugol's solution revealing a normal eating pattern. The cervix was circumferentially infiltrated with 10 cc of 1% lidocaine with 1-100,000 epinephrine. The ectocervical LEEP cone biopsy was performed with a wide loop. This was followed by the endocervical LEEP with a narrower loop. Post LEEP ECC was performed with a serrated curette. Rollerball cautery was used to cauterize the cone bed.  Astringent was applied to maximize hemostasis. Speculum was removed from the vagina. The vulvar lesion of the left labia majora was infiltrated with 2 cc of lidocaine with 1-100,000 epinephrine. A 3.5 mm punch biopsy was taken of the lesion. A figure-of-eight suture of 4-0 Monocryl was placed for hemostasis. Dermabond glue was placed over the incision. Procedure was then terminated with the patient being awakened, mobilized, and taken to recovery room in satisfactory condition.  Tracy A. Zipporah Plants, MD, [facility]

## 2016-03-16 NOTE — Anesthesia Postprocedure Evaluation (Signed)
Anesthesia Post Note  Patient: Tracy Madden  Procedure(s) Performed: Procedure(s) (LRB): LOOP ELECTROSURGICAL EXCISION PROCEDURE (LEEP) (N/A)  Patient location during evaluation: PACU Anesthesia Type: General Level of consciousness: awake and alert Pain management: pain level controlled Vital Signs Assessment: post-procedure vital signs reviewed and stable Respiratory status: spontaneous breathing, nonlabored ventilation, respiratory function stable and patient connected to nasal cannula oxygen Cardiovascular status: blood pressure returned to baseline and stable Postop Assessment: no signs of nausea or vomiting Anesthetic complications: no     Last Vitals:  Vitals:   03/16/16 1216 03/16/16 1302  BP: 109/64 (!) 102/59  Pulse: 67 67  Resp: 16 16  Temp: 36.6 C     Last Pain:  Vitals:   03/16/16 1216  TempSrc: Temporal  PainSc:                  Cyrstal Leitz S

## 2016-03-16 NOTE — Discharge Instructions (Signed)

## 2016-03-16 NOTE — Transfer of Care (Signed)
Immediate Anesthesia Transfer of Care Note  Patient: Tracy Madden  Procedure(s) Performed: Procedure(s): LOOP ELECTROSURGICAL EXCISION PROCEDURE (LEEP) (N/A)  Patient Location: PACU  Anesthesia Type:General  Level of Consciousness: awake  Airway & Oxygen Therapy: Patient Spontanous Breathing  Post-op Assessment: Report given to RN  Post vital signs: stable  Last Vitals:  Vitals:   03/16/16 0915 03/16/16 1135  BP: 115/60 (P) 110/71  Pulse: 66   Resp: 12   Temp: 36.4 C (P) 36.8 C    Last Pain:  Vitals:   03/16/16 0915  TempSrc: Oral         Complications: No apparent anesthesia complications

## 2016-03-16 NOTE — H&P (View-Only) (Signed)
Subjective:  PREOPERATIVE HISTORY AND PHYSICAL    Date of surgery: 03/16/2016 Procedure: LEEP cone biopsy of cervix; vulvar biopsy Diagnoses: 1. CIN-1 of cervix 2. Vulvar lesion   Patient is a 45 y.o. No obstetric history on file.female scheduled for LEEP cone blot biopsy of the cervix and vulvar biopsy of atypical lesion. History of cervical dysplasia in past. Status post LEEP cone biopsy in 2002 Status post hysteroscopy/D&C with NovaSure endometrial ablation in 2016 07/02/2015 colposcopy was inadequate; abnormal vessel at 3:00; biopsy consistent with CIN-1 02/11/2016 colposcopy was inadequate; 4 mm circular blood vessel at 9:00 noted, cervix with wind swept change. Patient has left labia majora lesion, chronic for which biopsies desired. 3.5 mm punch biopsy will be performed.  Menstrual History: OB History    No data available     No LMP recorded. Patient has had an ablation.    Past Medical History:  Diagnosis Date  . Abdominal pain, LLQ   . Abnormal uterine bleeding   . Anemia   . Cervical dysplasia   . Endometriosis   . Fibroid   . Melanoma ([place])    RT SHOULDER  . Paratubal cyst   . Prediabetes     Past Surgical History:  Procedure Laterality Date  . DILATION AND CURETTAGE OF UTERUS    . HYSTEROSCOPY    . LAPAROSCOPY     DRAINAGE AND EXCISION OF PARATUBAL CYST  . LEEP  2002  . NOVASURE ABLATION  2016    OB History  No data available    Social History   Social History  . Marital status: Married    Spouse name: N/A  . Number of children: N/A  . Years of education: N/A   Social History Main Topics  . Smoking status: Never Smoker  . Smokeless tobacco: Never Used  . Alcohol use Yes     Comment: OCCAS  . Drug use: No  . Sexual activity: Yes    Birth control/ protection: Surgical   Other Topics Concern  . None   Social History Narrative  . None    Family History  Problem Relation Age of Onset  . Cancer Neg Hx      (Not in a hospital  admission)  Allergies  Allergen Reactions  . Aspirin Shortness Of Breath    Inhale aspirin, no reaction to oral aspirin   . Eggs Or Egg-Derived Products Anaphylaxis  . Niacin Hives and Rash    Review of Systems Constitutional: No recent fever/chills/sweats Respiratory: No recent cough/bronchitis Cardiovascular: No chest pain Gastrointestinal: No recent nausea/vomiting/diarrhea Genitourinary: No UTI symptoms Hematologic/lymphatic:No history of coagulopathy or recent blood thinner use    Objective:    BP 112/71   Pulse 89   Ht 5\' 3"  (1.6 m)   Wt 138 lb 9.6 oz (62.9 kg)   BMI 24.55 kg/m   General:   Normal  Skin:   normal  HEENT:  Normal  Neck:  Supple without Adenopathy or Thyromegaly  Lungs:   Heart:              Breasts:   Abdomen:  Pelvis:  M/S   Extremeties:  Neuro:    clear to auscultation bilaterally   Normal without murmur   Not Examined   soft, non-tender; bowel sounds normal; no masses,  no organomegaly   Exam deferred to OR  No CVAT  Warm/Dry   Normal          Assessment:    1. Chronic persistent cervical dysplasia-CIN-1  2. History of prior LEEP cone biopsy 2002 3. Vulvar lesion left labia majora   Plan:  LEEP cone biopsy of the cervix Vulvar biopsy-3.5 mm punch  Preop counseling: Patient is to undergo LEEP cone biopsy of the cervix on 03/16/2016. She also will have a vulvar biopsy at the same time. She is understanding of the planned procedures and is aware of and is accepting of all surgical risks which include but are not limited to bleeding, infection, pelvic organ injury with need for repair, blood clot disorders, anesthesia risks, etc. All questions have been answered. Informed consent is given. Patient is ready and willing to proceed with surgery as scheduled.  [name], MD  Note: This dictation was prepared with Dragon dictation along with smaller phrase technology. Any transcriptional errors that result from this process  are unintentional.

## 2016-03-18 LAB — SURGICAL PATHOLOGY

## 2016-03-25 ENCOUNTER — Encounter: Payer: Self-pay | Admitting: Physician Assistant

## 2016-03-25 ENCOUNTER — Ambulatory Visit: Payer: Self-pay | Admitting: Physician Assistant

## 2016-03-25 VITALS — BP 110/70 | HR 84 | Temp 98.4°F

## 2016-03-25 DIAGNOSIS — J0100 Acute maxillary sinusitis, unspecified: Secondary | ICD-10-CM

## 2016-03-25 MED ORDER — FLUCONAZOLE 150 MG PO TABS: ORAL_TABLET | ORAL | 0 refills | Status: DC

## 2016-03-25 MED ORDER — AMOXICILLIN 875 MG PO TABS: 875.0000 mg | ORAL_TABLET | Freq: Two times a day (BID) | ORAL | 0 refills | Status: DC

## 2016-03-25 MED ORDER — PREDNISONE 10 MG PO TABS: 30.0000 mg | ORAL_TABLET | Freq: Every day | ORAL | 0 refills | Status: DC

## 2016-03-25 NOTE — Progress Notes (Signed)
S: C/o sinus pain  and congestion for 7 days, no fever, chills, cp/sob, v/d; cannot get mucus out, just a lot of pressure, feels very dry, no cough  Using otc meds: decongestant  O: PE: vitals wnl, nad, perrl eomi, normocephalic, tms dull, nasal mucosa red and swollen, throat injected, neck supple no lymph, lungs c t a, cv rrr, neuro intact  A:  Acute sinusitis   P: drink fluids, continue regular meds , use otc meds of choice, return if not improving in 5 days, return earlier if worsening, amoxil 875, prednisone 30 mg qd x 3d, diflucan 150mg  #2 nr

## 2016-03-31 ENCOUNTER — Ambulatory Visit (INDEPENDENT_AMBULATORY_CARE_PROVIDER_SITE_OTHER): Payer: 59 | Admitting: Obstetrics and Gynecology

## 2016-03-31 ENCOUNTER — Encounter: Payer: Self-pay | Admitting: Obstetrics and Gynecology

## 2016-03-31 DIAGNOSIS — N900 Mild vulvar dysplasia: Secondary | ICD-10-CM | POA: Insufficient documentation

## 2016-03-31 DIAGNOSIS — Z9889 Other specified postprocedural states: Secondary | ICD-10-CM | POA: Insufficient documentation

## 2016-03-31 DIAGNOSIS — Z111 Encounter for screening for respiratory tuberculosis: Secondary | ICD-10-CM | POA: Diagnosis not present

## 2016-03-31 NOTE — Patient Instructions (Signed)
1. Return in 6 months for colposcopy of cervix and vulva 2. Take Diflucan weekly 2; then may use boric acid capsules

## 2016-03-31 NOTE — Progress Notes (Signed)
Chief complaint: 1. Postop check 2. Status post LEEP cone biopsy, and vulvar biopsy  Pathology: DIAGNOSIS:  A. ECTOCERVIX, STITCH AT 12:00; LEEP:  - LOW-GRADE SQUAMOUS INTRAEPITHELIAL LESION (LSIL/CIN-1).  - DYSPLASIA INVOLVES ALL QUADRANTS AND EXTENDS TO BOTH ENDOCERVICAL AND  ECTOCERVICAL INKED MARGINS.  - LIMITED TRANSFORMATION ZONE PRESENT IN THIS SPECIMEN.   B. ENDOCERVIX, POST LEEP; CURETTINGS:  - FEW STRIPS OF BENIGN GLANDULAR ENDOCERVICAL EPITHELIUM.  - NEGATIVE FOR SQUAMOUS METAPLASIA, DYSPLASIA, AND MALIGNANCY.   C. ENDOCERVIX; LEEP:  - LOW-GRADE SQUAMOUS INTRAEPITHELIAL LESION (LSIL/CIN-1) INVOLVING  SQUAMOUS METAPLASIA.  - DYSPLASIA EXTENDS TO BOTH ENDOCERVICAL AND ECTOCERVICAL INKED MARGINS.  - SQUAMOUS METAPLASIA FOCALLY EXTENDS TO ENDOCERVICAL MARGIN.   D. VULVAR LESION; PUNCH BIOPSY:  - LOW-GRADE SQUAMOUS INTRAEPITHELIAL LESION (LSIL/VIN-1).   SUBJECTIVE: Minimal discharge is noted at this time. She did have bleeding for approximately one week postop.    OBJECTIVE: BP 110/70   Pulse 69   Ht 5\' 3"  (1.6 m)   Wt 142 lb 3.2 oz (64.5 kg)   BMI 25.19 kg/m  Pelvic exam: Speculum-cone bed is healing; moderate yellow-white discharge, mucoid  ASSESSMENT: 1. Status post LEEP cone biopsy 2. Extensive CIN-1 with positive ectocervical and endocervical margins; negative ECC 3. 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 Return in 6 months for colposcopy of cervix and vulva 2. May use Diflucan weekly 2; then, boric acid capsules  [name], MD

## 2016-05-26 DIAGNOSIS — H04123 Dry eye syndrome of bilateral lacrimal glands: Secondary | ICD-10-CM | POA: Diagnosis not present

## 2016-05-26 DIAGNOSIS — H1045 Other chronic allergic conjunctivitis: Secondary | ICD-10-CM | POA: Diagnosis not present

## 2016-07-16 DIAGNOSIS — Z872 Personal history of diseases of the skin and subcutaneous tissue: Secondary | ICD-10-CM | POA: Diagnosis not present

## 2016-07-16 DIAGNOSIS — Z09 Encounter for follow-up examination after completed treatment for conditions other than malignant neoplasm: Secondary | ICD-10-CM | POA: Diagnosis not present

## 2016-07-16 DIAGNOSIS — D485 Neoplasm of uncertain behavior of skin: Secondary | ICD-10-CM | POA: Diagnosis not present

## 2016-09-23 ENCOUNTER — Encounter: Payer: 59 | Admitting: Obstetrics and Gynecology

## 2016-10-01 ENCOUNTER — Encounter: Payer: 59 | Admitting: Obstetrics and Gynecology

## 2016-10-01 DIAGNOSIS — D225 Melanocytic nevi of trunk: Secondary | ICD-10-CM | POA: Diagnosis not present

## 2016-10-01 DIAGNOSIS — L538 Other specified erythematous conditions: Secondary | ICD-10-CM | POA: Diagnosis not present

## 2016-10-05 ENCOUNTER — Ambulatory Visit: Payer: Self-pay | Admitting: Physician Assistant

## 2016-10-05 ENCOUNTER — Encounter: Payer: Self-pay | Admitting: Physician Assistant

## 2016-10-05 VITALS — BP 110/70 | HR 80 | Temp 98.8°F

## 2016-10-05 DIAGNOSIS — J0100 Acute maxillary sinusitis, unspecified: Secondary | ICD-10-CM

## 2016-10-05 MED ORDER — AZITHROMYCIN 250 MG PO TABS: ORAL_TABLET | ORAL | 0 refills | Status: DC

## 2016-10-05 NOTE — Progress Notes (Signed)
S: C/o sinus pain  and congestion for 7-8 days, no fever, chills, cp/sob, v/d; mucus is green and thick, took a left over antibiotic and felt a little better but only had 2 days worth, .some sinus headache on left side only   Using otc meds:   O: PE: vitals wnl, nad, perrl eomi, normocephalic, tms dull, nasal mucosa red and swollen, throat injected, neck supple no lymph, lungs c t a, cv rrr, neuro intact, frontal and max sinus tender to palp on left  A:  Acute sinusitis   P: drink fluids, continue regular meds , use otc meds of choice, return if not improving in 5 days, return earlier if worsening  zpack

## 2016-10-20 ENCOUNTER — Ambulatory Visit (INDEPENDENT_AMBULATORY_CARE_PROVIDER_SITE_OTHER): Payer: 59 | Admitting: Obstetrics and Gynecology

## 2016-10-20 ENCOUNTER — Encounter: Payer: Self-pay | Admitting: Obstetrics and Gynecology

## 2016-10-20 VITALS — BP 113/70 | HR 79 | Ht 63.0 in | Wt 139.2 lb

## 2016-10-20 DIAGNOSIS — N882 Stricture and stenosis of cervix uteri: Secondary | ICD-10-CM | POA: Diagnosis not present

## 2016-10-20 DIAGNOSIS — Z9889 Other specified postprocedural states: Secondary | ICD-10-CM

## 2016-10-20 DIAGNOSIS — N900 Mild vulvar dysplasia: Secondary | ICD-10-CM | POA: Diagnosis not present

## 2016-10-20 DIAGNOSIS — N870 Mild cervical dysplasia: Secondary | ICD-10-CM | POA: Diagnosis not present

## 2016-10-20 NOTE — Patient Instructions (Signed)
1. Pap smear is done today 2. Colposcopy of vulva and cervix are done today without obvious signs of abnormality 3. Return in 3 months for endocervical canal dilation and ECC due to cervical stenosis

## 2016-10-20 NOTE — Progress Notes (Signed)
Chief complaint: 1. 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 History of VIN 1  Patient presents for colposcopy. She denies major interval health issues. No pelvic pain or abnormal uterine bleeding is identified. Patient denies vulvar itching, burning, or ulceration.  Past medical history, past surgical history, problem list, medications, and allergies are reviewed  OBJECTIVE: BP 113/70   Pulse 79   Ht 5\' 3"  (1.6 m)   Wt 139 lb 3.2 oz (63.1 kg)   BMI 24.66 kg/m  Pleasant well-appearing female in no acute distress Abdomen: Soft, nontender without organomegaly Pelvic exam: External genitalia-normal BUS-normal Vagina-good estrogen effect; no lesions; no discharge Cervix-post LEEP changes are noted; cervical os is stenotic; no cervical motion tenderness Uterus-not examined Adnexa-not examined  PROCEDURE: Colposcopy of cervix and upper adjacent vagina without biopsy Indications: History of CIN-1, status post LEEP cone biopsy with positive cervical margin Findings: Inadequate colposcopy with inability to see the squamocolumnar junction. Ectocervix notable for post LEEP changes with wind swept appearance Biopsies:  1. Pap smear with coated testing for 16/18 HPV type Description: Verbal consent is obtained. Patient was placed in the dorsal lithotomy position. A Graves' speculum was placed in the vagina to facilitate visualization of the cervix and upper adjacent vagina. Acetic acid solution is applied topically with Fox swabs to the cervix and vagina to facilitate visualization. Colposcopy is performed with the noted findings above being identified. Pap smear is obtained. Cervical biopsies are deferred at this time. Procedure was terminated with all instrumentation being removed from the vagina. Procedure was well-tolerated. Palpitations are non-.  PROCEDURE: Colposcopy of vulva Indications: History of CIN-1 on excisional biopsy of vulvar  lesion Findings: No abnormal appearing lesions or staining is noted following acetic acid application to the vulva Biopsies: None Description: Verbal consent is obtained. Patient is in the dorsal lithotomy position. Acetic acid soaked 4 x 4 pads were placed on the vulva. Following removal colposcopy is performed with no abnormal findings being identified. No biopsies were taken.  ASSESSMENT: 1. History of CIN-1 status post LEEP cone biopsy with positive endocervical margin 2. Cervical stenosis with inability to completely colposcopy and ECC 3. History of CIN-1 status post excisional biopsy of vulvar lesion 4. Normal vulvar colposcopy today; patient is asymptomatic  PLAN: 1. Colposcopy is as noted 2. Pap smear with coated testing for HPV 16/18 is obtained 3. Patient is to return in 3 months for endocervical curettage under paracervical block with surgery endocervical canal dilation   [name], MD  Note: This dictation was prepared with Dragon dictation along with smaller phrase technology. Any transcriptional errors that result from this process are unintentional..

## 2016-10-23 LAB — PAPIG, HPV, RFX 16/18
HPV, HIGH-RISK: NEGATIVE
PAP SMEAR COMMENT: 0

## 2016-11-09 ENCOUNTER — Ambulatory Visit: Payer: Self-pay | Admitting: Physician Assistant

## 2016-11-09 DIAGNOSIS — R1084 Generalized abdominal pain: Secondary | ICD-10-CM | POA: Diagnosis not present

## 2016-11-09 DIAGNOSIS — R197 Diarrhea, unspecified: Secondary | ICD-10-CM

## 2016-11-09 NOTE — Progress Notes (Signed)
Pt here due to diarrhea for 2 weeks, is really watery and is concerned she has c. Diff; states her pcp is going to see her at 2pm so she can get testing.  Just didn't know who she needed to tell Explained to pt that she needs stool cultures, if + for c. Diff she should let her supervisor know and contact health at work

## 2016-11-10 DIAGNOSIS — R197 Diarrhea, unspecified: Secondary | ICD-10-CM | POA: Diagnosis not present

## 2016-11-10 DIAGNOSIS — R1084 Generalized abdominal pain: Secondary | ICD-10-CM | POA: Diagnosis not present

## 2016-12-01 ENCOUNTER — Other Ambulatory Visit: Payer: Self-pay | Admitting: Obstetrics and Gynecology

## 2016-12-01 DIAGNOSIS — Z1231 Encounter for screening mammogram for malignant neoplasm of breast: Secondary | ICD-10-CM

## 2016-12-28 DIAGNOSIS — R739 Hyperglycemia, unspecified: Secondary | ICD-10-CM | POA: Diagnosis not present

## 2016-12-28 DIAGNOSIS — M545 Low back pain: Secondary | ICD-10-CM | POA: Diagnosis not present

## 2016-12-28 DIAGNOSIS — E785 Hyperlipidemia, unspecified: Secondary | ICD-10-CM | POA: Diagnosis not present

## 2016-12-28 DIAGNOSIS — L659 Nonscarring hair loss, unspecified: Secondary | ICD-10-CM | POA: Diagnosis not present

## 2016-12-30 DIAGNOSIS — M545 Low back pain: Secondary | ICD-10-CM | POA: Diagnosis not present

## 2016-12-30 DIAGNOSIS — R319 Hematuria, unspecified: Secondary | ICD-10-CM | POA: Diagnosis not present

## 2017-01-01 DIAGNOSIS — R739 Hyperglycemia, unspecified: Secondary | ICD-10-CM | POA: Diagnosis not present

## 2017-01-01 DIAGNOSIS — R5381 Other malaise: Secondary | ICD-10-CM | POA: Diagnosis not present

## 2017-01-01 DIAGNOSIS — R5383 Other fatigue: Secondary | ICD-10-CM | POA: Diagnosis not present

## 2017-01-01 DIAGNOSIS — E785 Hyperlipidemia, unspecified: Secondary | ICD-10-CM | POA: Diagnosis not present

## 2017-01-13 ENCOUNTER — Other Ambulatory Visit: Payer: Self-pay | Admitting: Family Medicine

## 2017-01-14 ENCOUNTER — Other Ambulatory Visit: Payer: Self-pay | Admitting: Family Medicine

## 2017-01-14 DIAGNOSIS — R1084 Generalized abdominal pain: Secondary | ICD-10-CM

## 2017-01-21 ENCOUNTER — Ambulatory Visit: Payer: 59

## 2017-01-26 DIAGNOSIS — H5213 Myopia, bilateral: Secondary | ICD-10-CM | POA: Diagnosis not present

## 2017-01-28 ENCOUNTER — Ambulatory Visit (INDEPENDENT_AMBULATORY_CARE_PROVIDER_SITE_OTHER): Payer: 59 | Admitting: Obstetrics and Gynecology

## 2017-01-28 ENCOUNTER — Ambulatory Visit
Admission: RE | Admit: 2017-01-28 | Discharge: 2017-01-28 | Disposition: A | Discharge status: Home / Self Care | Payer: 59 | Source: Ambulatory Visit | Attending: Obstetrics and Gynecology | Admitting: Obstetrics and Gynecology

## 2017-01-28 ENCOUNTER — Encounter: Payer: Self-pay | Admitting: Obstetrics and Gynecology

## 2017-01-28 VITALS — BP 111/69 | HR 69 | Ht 63.0 in | Wt 142.3 lb

## 2017-01-28 DIAGNOSIS — Z1231 Encounter for screening mammogram for malignant neoplasm of breast: Secondary | ICD-10-CM | POA: Diagnosis not present

## 2017-01-28 DIAGNOSIS — N870 Mild cervical dysplasia: Secondary | ICD-10-CM

## 2017-01-28 DIAGNOSIS — N882 Stricture and stenosis of cervix uteri: Secondary | ICD-10-CM

## 2017-01-28 DIAGNOSIS — Z9889 Other specified postprocedural states: Secondary | ICD-10-CM | POA: Diagnosis not present

## 2017-01-28 NOTE — Patient Instructions (Signed)
1.  Endocervical canal dilation was attempted today but unsuccessful due to severe cervical stenosis 2.  Last Pap smears demonstrated no high risk HPV. 3.  Recommend follow-up in 6 months for Pap/HPV testing

## 2017-01-28 NOTE — Progress Notes (Signed)
Chief complaint: 1.  Cervical dysplasia, status post LEEP cone biopsy with positive endocervical margin 2.  Cervical stenosis  Patient presents for endocervical canal dilation and ECC for monitoring of possible cervical dysplasia.  She has known cervical stenosis following her LEEP cone biopsy.  She is not experiencing any atypical vaginal discharge, vaginal bleeding, or pelvic pain. The patient's last Pap smear demonstrated normal cells and a negative high risk HPV.  Past medical history, past surgical history, problem list, medications, and allergies are reviewed  OBJECTIVE: BP 111/69   Pulse 69   Ht 5\' 3"  (1.6 m)   Wt 142 lb 4.8 oz (64.5 kg)   LMP  (LMP Unknown)   BMI 25.21 kg/m  Pleasant female no acute distress.  Alert and oriented. Pelvic exam: External genitalia-normal BUS-normal Vagina-normal estrogen effect; no lesions Cervix-no gross lesions; cervical stenosis with obliteration of the endocervical canal  PROCEDURE: Endocervical canal dilation with ECC Indications: History of the cervical dysplasia with a positive endocervical margin notable for dysplasia on LEEP cone biopsy Findings: Severe cervical stenosis with obliteration of the endocervical canal Biopsies: None obtained Description: The patient is placed in dorsolithotomy position.  She has given verbal consent for the procedure.  Graves speculum was placed into the vagina to facilitate visualization of the cervix and upper vagina.  Paracervical block with 10 cc of 1% lidocaine without epinephrine is injected at the 3:00 and 9:00 positions.  Single-tooth tenaculum was placed onto the cervix to facilitate the procedure.  Lacrimal duct probes are used in an attempt to identify and dilate the endocervical canal.  Significant resistance is encountered and the endocervical canal is unable to be cannulated.  The procedure is terminated without obtaining in the ECC specimen.  Blood loss-minimal.  Complications-none.  Procedure is  well-tolerated.  ASSESSMENT: 1.  Severe cervical stenosis with unsuccessful endocervical canal dilation following paracervical block 2.  Past history of cervical dysplasia with a positive endocervical margin on LEEP cone biopsy 3.  Last Pap smear/HPV is negative/negative  PLAN: 1.  The endocervical canal dilation and ECC is aborted 2.  The patient is to return in 6 months for Pap smear/HPV testing 3.  Follow-up as needed if abnormal bleeding or pelvic pain develop.  [name], MD  Note: This dictation was prepared with Dragon dictation along with smaller phrase technology. Any transcriptional errors that result from this process are unintentional.

## 2017-02-01 ENCOUNTER — Ambulatory Visit
Admission: RE | Admit: 2017-02-01 | Discharge: 2017-02-01 | Disposition: A | Discharge status: Home / Self Care | Payer: 59 | Source: Ambulatory Visit | Attending: Family Medicine | Admitting: Family Medicine

## 2017-02-01 DIAGNOSIS — K769 Liver disease, unspecified: Secondary | ICD-10-CM | POA: Insufficient documentation

## 2017-02-01 DIAGNOSIS — R109 Unspecified abdominal pain: Secondary | ICD-10-CM | POA: Insufficient documentation

## 2017-02-01 DIAGNOSIS — M545 Low back pain: Secondary | ICD-10-CM | POA: Diagnosis not present

## 2017-02-01 DIAGNOSIS — R1084 Generalized abdominal pain: Secondary | ICD-10-CM

## 2017-02-01 MED ORDER — IOPAMIDOL (ISOVUE-300) INJECTION 61%
100.0000 mL | Freq: Once | INTRAVENOUS | Status: AC | PRN
  Administered 2017-02-01: 100 mL via INTRAVENOUS

## 2017-02-03 ENCOUNTER — Other Ambulatory Visit: Payer: Self-pay | Admitting: Family Medicine

## 2017-02-03 DIAGNOSIS — K769 Liver disease, unspecified: Secondary | ICD-10-CM

## 2017-02-11 ENCOUNTER — Ambulatory Visit
Admission: RE | Admit: 2017-02-11 | Discharge: 2017-02-11 | Disposition: A | Discharge status: Home / Self Care | Payer: 59 | Source: Ambulatory Visit | Attending: Family Medicine | Admitting: Family Medicine

## 2017-02-11 DIAGNOSIS — K769 Liver disease, unspecified: Secondary | ICD-10-CM

## 2017-02-11 DIAGNOSIS — R160 Hepatomegaly, not elsewhere classified: Secondary | ICD-10-CM | POA: Diagnosis not present

## 2017-02-11 DIAGNOSIS — R932 Abnormal findings on diagnostic imaging of liver and biliary tract: Secondary | ICD-10-CM | POA: Diagnosis not present

## 2017-02-11 MED ORDER — GADOXETATE DISODIUM 0.25 MMOL/ML IV SOLN
10.0000 mL | Freq: Once | INTRAVENOUS | Status: AC | PRN
  Administered 2017-02-11: 6 mL via INTRAVENOUS

## 2017-02-12 ENCOUNTER — Telehealth: Payer: Self-pay | Admitting: Obstetrics and Gynecology

## 2017-02-12 NOTE — Telephone Encounter (Signed)
Patient called asking if there were any results from her last visit. Thanks

## 2017-02-12 NOTE — Telephone Encounter (Signed)
Pt aware per vm no testing/procedure at [facility]. Pt is to f/u in 6 months for repeat pap. Sooner if aub or pp.

## 2017-02-22 DIAGNOSIS — K769 Liver disease, unspecified: Secondary | ICD-10-CM | POA: Diagnosis not present

## 2017-02-22 DIAGNOSIS — R319 Hematuria, unspecified: Secondary | ICD-10-CM | POA: Diagnosis not present

## 2017-02-22 DIAGNOSIS — M533 Sacrococcygeal disorders, not elsewhere classified: Secondary | ICD-10-CM | POA: Diagnosis not present

## 2017-05-10 ENCOUNTER — Encounter: Payer: Self-pay | Admitting: Obstetrics and Gynecology

## 2017-05-10 ENCOUNTER — Ambulatory Visit (INDEPENDENT_AMBULATORY_CARE_PROVIDER_SITE_OTHER): Payer: 59 | Admitting: Obstetrics and Gynecology

## 2017-05-10 VITALS — BP 105/64 | HR 82 | Ht 60.0 in | Wt 141.0 lb

## 2017-05-10 DIAGNOSIS — Z9889 Other specified postprocedural states: Secondary | ICD-10-CM

## 2017-05-10 DIAGNOSIS — R102 Pelvic and perineal pain: Secondary | ICD-10-CM

## 2017-05-10 DIAGNOSIS — N809 Endometriosis, unspecified: Secondary | ICD-10-CM | POA: Diagnosis not present

## 2017-05-10 DIAGNOSIS — N882 Stricture and stenosis of cervix uteri: Secondary | ICD-10-CM

## 2017-05-10 DIAGNOSIS — N912 Amenorrhea, unspecified: Secondary | ICD-10-CM

## 2017-05-10 LAB — POCT URINALYSIS DIPSTICK
Bilirubin, UA: NEGATIVE
GLUCOSE UA: NEGATIVE
Ketones, UA: NEGATIVE
LEUKOCYTES UA: NEGATIVE
Nitrite, UA: NEGATIVE
Odor: NEGATIVE
Protein, UA: NEGATIVE
Spec Grav, UA: 1.025 (ref 1.010–1.025)
Urobilinogen, UA: 0.2 E.U./dL
pH, UA: 5 (ref 5.0–8.0)

## 2017-05-10 NOTE — Patient Instructions (Signed)
1.  Nuswab plus to rule out STD 2.  Pelvic ultrasound to assess for pelvic pain 3.  Results will be made available through my chart 4.  If there is a thickened lining of the uterus, endocervical canal dilation may be recommended.

## 2017-05-11 ENCOUNTER — Ambulatory Visit (INDEPENDENT_AMBULATORY_CARE_PROVIDER_SITE_OTHER): Payer: 59

## 2017-05-11 DIAGNOSIS — R102 Pelvic and perineal pain: Secondary | ICD-10-CM

## 2017-05-11 DIAGNOSIS — N912 Amenorrhea, unspecified: Secondary | ICD-10-CM | POA: Diagnosis not present

## 2017-05-11 DIAGNOSIS — N882 Stricture and stenosis of cervix uteri: Secondary | ICD-10-CM | POA: Diagnosis not present

## 2017-05-11 NOTE — Progress Notes (Signed)
Chief complaint: 1.  Abdominal/pelvic bloating 2.  History of endometriosis 3.  Amenorrhea 4.  History of cervical dysplasia; status post LEEP cone biopsy  Tracy Madden presents today for vague abdominal pelvic pain associated with amenorrhea and a cyclic nature to her discomfort.  She feels generalized discomfort; symptoms are made worse with sitting.  Symptoms improved with some Tylenol.  She does not report any significant changes in bowel function or bladder function.  She is not experiencing any fever chills or sweats, nausea vomiting or diarrhea. She is status post hysteroscopy/D&C; status post LEEP cone biopsy of the cervix; status post NovaSure endometrial ablation.  She is status post attempted endocervical canal dilation without success under paracervical block in November 2018.  Past Medical History:  Diagnosis Date  . Abdominal pain, LLQ   . Abnormal uterine bleeding   . Anemia   . Cervical dysplasia   . Endometriosis   . Fibroid   . Melanoma ([place])    RT SHOULDER  . Paratubal cyst   . Prediabetes     Past Surgical History:  Procedure Laterality Date  . DILATION AND CURETTAGE OF UTERUS    . HYSTEROSCOPY    . LAPAROSCOPY     DRAINAGE AND EXCISION OF PARATUBAL CYST  . LEEP  2002  . LEEP N/A 03/16/2016   Procedure: LOOP ELECTROSURGICAL EXCISION PROCEDURE (LEEP);  Surgeon: [name], MD;  Location: [facility] ORS;  Service: Gynecology;  Laterality: N/A;  . NOVASURE ABLATION  2016  . TONSILLECTOMY     OBJECTIVE: BP 105/64   Pulse 82   Ht 5' (1.524 m)   Wt 141 lb (64 kg)   BMI 27.54 kg/m  Pleasant female in no acute distress.  Alert and oriented. Back: No CVA tenderness or spinal tenderness Abdomen: Soft, nondistended, without peritoneal signs, without organomegaly Pelvic: External genitalia-normal BUS-normal Vagina-good estrogen effect; no lesions; no discharge Cervix-post LEEP changes are noted; the cervical loss is obstructed/stenotic; minimal cervical motion  tenderness is noted. Uterus-midplane, mobile, minimally tender, not enlarged Adnexa-nonpalpable and nontender Rectovaginal-normal external exam  ASSESSMENT: 1.  Chronic abdominal pelvic pain, bloating, cyclic in nature 2.  Amenorrhea; status post NovaSure endometrial ablation 3.  History of cervical dysplasia, status post LEEP cone biopsy 4.  History of endometriosis  PLAN: 1.  Nuswab plus to rule out STD 2.  Pelvic ultrasound to assess endometrium and adnexa 3.  Follow-up will be determined based on ultrasound findings.  A total of 15 minutes were spent face-to-face with the patient during this encounter and over half of that time dealt with counseling and coordination of care.  [name], MD  Note: This dictation was prepared with Dragon dictation along with smaller phrase technology. Any transcriptional errors that result from this process are unintentional.

## 2017-05-12 LAB — CHLAMYDIA/GONOCOCCUS/TRICHOMONAS, NAA
CHLAMYDIA BY NAA: NEGATIVE
GONOCOCCUS BY NAA: NEGATIVE
Trich vag by NAA: NEGATIVE

## 2017-05-12 LAB — URINE CULTURE: ORGANISM ID, BACTERIA: NO GROWTH

## 2017-07-20 ENCOUNTER — Other Ambulatory Visit: Payer: Self-pay | Admitting: Obstetrics and Gynecology

## 2017-07-20 ENCOUNTER — Other Ambulatory Visit: Payer: Self-pay | Admitting: Family Medicine

## 2017-07-20 DIAGNOSIS — R935 Abnormal findings on diagnostic imaging of other abdominal regions, including retroperitoneum: Secondary | ICD-10-CM

## 2017-07-28 ENCOUNTER — Encounter: Payer: 59 | Admitting: Obstetrics and Gynecology

## 2017-08-18 ENCOUNTER — Ambulatory Visit
Admission: RE | Admit: 2017-08-18 | Discharge: 2017-08-18 | Disposition: A | Discharge status: Home / Self Care | Payer: 59 | Source: Ambulatory Visit | Attending: Family Medicine | Admitting: Family Medicine

## 2017-08-18 DIAGNOSIS — K7689 Other specified diseases of liver: Secondary | ICD-10-CM | POA: Diagnosis not present

## 2017-08-18 DIAGNOSIS — R935 Abnormal findings on diagnostic imaging of other abdominal regions, including retroperitoneum: Secondary | ICD-10-CM

## 2017-08-18 MED ORDER — GADOBENATE DIMEGLUMINE 529 MG/ML IV SOLN
20.0000 mL | Freq: Once | INTRAVENOUS | Status: AC | PRN
  Administered 2017-08-18: 13 mL via INTRAVENOUS

## 2017-08-25 ENCOUNTER — Encounter: Payer: Self-pay | Admitting: Obstetrics and Gynecology

## 2017-08-25 ENCOUNTER — Ambulatory Visit (INDEPENDENT_AMBULATORY_CARE_PROVIDER_SITE_OTHER): Payer: 59 | Admitting: Obstetrics and Gynecology

## 2017-08-25 VITALS — BP 113/72 | HR 90 | Ht 60.0 in | Wt 141.2 lb

## 2017-08-25 DIAGNOSIS — N870 Mild cervical dysplasia: Secondary | ICD-10-CM

## 2017-08-25 NOTE — Patient Instructions (Signed)
1.  Hysterectomy is to be scheduled early in September 2019-LAVH versus TAH 2.  Return for preop appointment the week before surgery 3.  Pap smear is done today and results will be made available when ready

## 2017-08-25 NOTE — Progress Notes (Signed)
Chief complaint: 1.  Pap smear 2.  Surgery planning  Tracy Madden presents today for Pap smear.  She has a diagnosis of cervical dysplasia; is status post LEEP cone biopsy of the cervix with a positive endocervical margin; serial ECC's cannot be performed because of significant cervical stenosis. [name] has chronic lower abdominal pain; she is amenorrheic; she is status post endometrial ablation.  There is a history of uterine fibroids.  Last Pap smear on 10/20/2016 was negative/positive.  She has a history of endometriosis. Tracy Madden is desiring definitive surgery in the near future.  Past Medical History:  Diagnosis Date  . Abdominal pain, LLQ   . Abnormal uterine bleeding   . Anemia   . Cervical dysplasia   . Endometriosis   . Fibroid   . Melanoma ([place])    RT SHOULDER  . Paratubal cyst   . Prediabetes    Past Surgical History:  Procedure Laterality Date  . DILATION AND CURETTAGE OF UTERUS    . HYSTEROSCOPY    . LAPAROSCOPY     DRAINAGE AND EXCISION OF PARATUBAL CYST  . LEEP  2002  . LEEP N/A 03/16/2016   Procedure: LOOP ELECTROSURGICAL EXCISION PROCEDURE (LEEP);  Surgeon: [name], MD;  Location: [facility] ORS;  Service: Gynecology;  Laterality: N/A;  . NOVASURE ABLATION  2016  . TONSILLECTOMY     Review of systems: Abdominal bloating persists; no abnormal uterine bleeding; dyspareunia is present  OBJECTIVE: BP 113/72   Pulse 90   Ht 5' (1.524 m)   Wt 141 lb 3.2 oz (64 kg)   BMI 27.58 kg/m  Pleasant well-appearing female no acute distress.  Alert and oriented. Abdomen: Soft, nontender without organomegaly Pelvic exam: External genitalia-normal BUS-normal Vagina-good estrogen effect; no lesions; no discharge Cervix-post LEEP changes are noted; the cervical os is obstructed/stenotic; 2/4 cervical motion tenderness is noted. Uterus-midplane, mobile, 2/4 tender, top normal size  Adnexa-nonpalpable and mildly tender 1/4 Rectovaginal-normal external  exam Extremities: Warm and dry  ASSESSMENT: 1.  History of endometriosis 2.  History of uterine fibroid 3.  History of pelvic pain and bloating 4.  History of cervical dysplasia, status post LEEP cone biopsy with positive endocervical margin 5.  Cervical stenosis with inability to monitor the endocervical canal for possible dysplasia 6.  Last Pap smear is positive for high risk HPV 7.  Patient desires definitive surgery 8.  Pelvis is marginal for vaginal stretch may approach  PLAN: 1.  Pap smear today 2.  Schedule hysterectomy for early September 2019-LAVH versus TAH through midline incision 3.  Various options of management have been reviewed in detail with multiple questions answered  A total of 15 minutes were spent face-to-face with the patient during this encounter and over half of that time dealt with counseling and coordination of care.  [name], MD  Note: This dictation was prepared with Dragon dictation along with smaller phrase technology. Any transcriptional errors that result from this process are unintentional.

## 2017-08-28 LAB — IGP, COBASHPV16/18
HPV 16: NEGATIVE
HPV 18: NEGATIVE
HPV other hr types: NEGATIVE
PAP SMEAR COMMENT: 0

## 2017-12-14 ENCOUNTER — Encounter: Payer: 59 | Admitting: Obstetrics and Gynecology

## 2017-12-15 ENCOUNTER — Inpatient Hospital Stay: Admission: RE | Admit: 2017-12-15 | Payer: Self-pay | Source: Ambulatory Visit

## 2017-12-22 ENCOUNTER — Other Ambulatory Visit: Payer: Self-pay | Admitting: Obstetrics and Gynecology

## 2017-12-22 DIAGNOSIS — Z1231 Encounter for screening mammogram for malignant neoplasm of breast: Secondary | ICD-10-CM

## 2018-01-03 ENCOUNTER — Encounter: Payer: 59 | Admitting: Obstetrics and Gynecology

## 2018-02-02 ENCOUNTER — Encounter: Payer: Self-pay | Admitting: Obstetrics and Gynecology

## 2018-02-02 ENCOUNTER — Ambulatory Visit (INDEPENDENT_AMBULATORY_CARE_PROVIDER_SITE_OTHER): Payer: Commercial Managed Care - PPO | Admitting: Obstetrics and Gynecology

## 2018-02-02 VITALS — BP 104/64 | HR 66 | Ht 60.0 in | Wt 147.5 lb

## 2018-02-02 DIAGNOSIS — N882 Stricture and stenosis of cervix uteri: Secondary | ICD-10-CM

## 2018-02-02 DIAGNOSIS — N870 Mild cervical dysplasia: Secondary | ICD-10-CM

## 2018-02-02 DIAGNOSIS — Z01818 Encounter for other preprocedural examination: Secondary | ICD-10-CM

## 2018-02-02 DIAGNOSIS — Z9889 Other specified postprocedural states: Secondary | ICD-10-CM

## 2018-02-02 DIAGNOSIS — N809 Endometriosis, unspecified: Secondary | ICD-10-CM

## 2018-02-02 NOTE — Progress Notes (Signed)
PREOPERATIVE HISTORY AND PHYSICAL  Date of surgery: 02/14/2018 Diagnosis: 1.  Endometriosis 2.  History of cervical dysplasia Procedure: LAVH, bilateral salpingectomy, possible USO or BSO   Patient is a 47 y.o. No obstetric history on file.female scheduled for LAVH bilateral salpingectomy on 02/14/2018 for management of symptomatic endometriosis and history of cervical dysplasia. Patient has history of cervical dysplasia; she is status post LEEP cone biopsy of the cervix with a positive endocervical margin; ECCs are unable to be performed because of postoperative cervical stenosis. The patient does have history of chronic lower abdominal pain; she is amenorrheic; she is status post endometrial ablation.  She does have history of uterine fibroids and past history of endometriosis. The patient is desiring to proceed with definitive surgery at this time; she would like to have ovarian conservation if no pathology is identified at the time of surgery.  Pertinent Gynecological History: History of endocervical dysplasia; status post LEEP cone biopsy in 2002; cervical stenosis precludes serial ECCs for monitoring of endocervical dysplasia History of hysteroscopy/D&C with NovaSure endometrial ablation in 2016  No LMP recorded. Patient has had an ablation.    Past Medical History:  Diagnosis Date  . Abdominal pain, LLQ   . Abnormal uterine bleeding   . Anemia   . Cervical dysplasia   . Endometriosis   . Fibroid   . Melanoma ([place])    RT SHOULDER  . Paratubal cyst   . Prediabetes     Past Surgical History:  Procedure Laterality Date  . DILATION AND CURETTAGE OF UTERUS    . HYSTEROSCOPY    . LAPAROSCOPY     DRAINAGE AND EXCISION OF PARATUBAL CYST  . LEEP  2002  . LEEP N/A 03/16/2016   Procedure: LOOP ELECTROSURGICAL EXCISION PROCEDURE (LEEP);  Surgeon: [name], MD;  Location: [facility] ORS;  Service: Gynecology;  Laterality: N/A;  . NOVASURE ABLATION  2016  . TONSILLECTOMY       OB History  No data available    Social History   Socioeconomic History  . Marital status: Married    Spouse name: Not on file  . Number of children: Not on file  . Years of education: Not on file  . Highest education level: Not on file  Occupational History  . Not on file  Social Needs  . Financial resource strain: Not on file  . Food insecurity:    Worry: Not on file    Inability: Not on file  . Transportation needs:    Medical: Not on file    Non-medical: Not on file  Tobacco Use  . Smoking status: Never Smoker  . Smokeless tobacco: Never Used  Substance and Sexual Activity  . Alcohol use: Yes    Comment: OCCAS  . Drug use: No  . Sexual activity: Yes    Birth control/protection: Surgical  Lifestyle  . Physical activity:    Days per week: Not on file    Minutes per session: Not on file  . Stress: Not on file  Relationships  . Social connections:    Talks on phone: Not on file    Gets together: Not on file    Attends religious service: Not on file    Active member of club or organization: Not on file    Attends meetings of clubs or organizations: Not on file    Relationship status: Not on file  Other Topics Concern  . Not on file  Social History Narrative  . Not on file  Family History  Problem Relation Age of Onset  . Uterine cancer Mother   . Cancer Neg Hx      (Not in a hospital admission)  Allergies  Allergen Reactions  . Aspirin Shortness Of Breath and Other (See Comments)    Inhale aspirin, no reaction to oral aspirin   . Eggs Or Egg-Derived Products Anaphylaxis  . Niacin Hives, Rash and Other (See Comments)    [place] in a matter of seconds    Review of Systems Constitutional: No recent fever/chills/sweats Respiratory: No recent cough/bronchitis Cardiovascular: No chest pain Gastrointestinal: No recent nausea/vomiting/diarrhea Genitourinary: No UTI symptoms Hematologic/lymphatic:No history of coagulopathy or recent blood thinner  use    Objective:    BP 104/64   Pulse 66   Ht 5' (1.524 m)   Wt 147 lb 8 oz (66.9 kg)   BMI 28.81 kg/m   General:   Normal  Skin:   normal  HEENT:  Normal  Neck:  Supple without Adenopathy or Thyromegaly  Lungs:   Heart:              Breasts:   Abdomen:  Pelvis:  M/S   Extremeties:  Neuro:    clear to auscultation bilaterally   Normal without murmur   Not Examined   soft, non-tender; bowel sounds normal; no masses,  no organomegaly   Exam deferred to OR  No CVAT  Warm/Dry   Normal         02/02/2018: Pelvic exam-gynecoid, but with slight narrowing of the pelvic sidewalls  External genitalia-normal  BUS-normal  Vagina-normal estrogen effect  Cervix-stenosis of loss; no cervical motion tenderness  Uterus-midplane, normal size and shape, mobile, nontender  Adnexa-nonpalpable nontender Rectovaginal-normal external exam Assessment:    1.  Endometriosis 2.  History of cervical dysplasia status post LEEP cone biopsy with positive endocervical margin 3.  Cervical stenosis 4.  Pelvic pain 5.  Status post hysteroscopy/D&C with NovaSure endometrial ablation   Plan:  LAVH bilateral salpingectomy  Preop counseling: Patient is to undergo LAVH bilateral salpingectomy on 02/14/2018.  She is understanding of the planned procedures and is aware of and is accepting of all surgical risks which include but are not limited to bleeding, infection, pelvic organ injury with need for repair, blood clot disorders, anesthesia risk, etc.  All questions have been answered.  Informed consent is given.  Patient is very willing to proceed with surgery as scheduled.  [name], MD  Note: This dictation was prepared with Dragon dictation along with smaller phrase technology. Any transcriptional errors that result from this process are unintentional.

## 2018-02-02 NOTE — Patient Instructions (Signed)
1.  Preoperative evaluation is completed today. 2.  Return 10 days after surgery for postop check  Laparoscopically Assisted Vaginal Hysterectomy, Care After Refer to this sheet in the next few weeks. These instructions provide you with information on caring for yourself after your procedure. Your health care provider may also give you more specific instructions. Your treatment has been planned according to current medical practices, but problems sometimes occur. Call your health care provider if you have any problems or questions after your procedure. What can I expect after the procedure? After your procedure, it is typical to have the following:  Abdominal pain. You will be given pain medicine to control it.  Sore throat from the breathing tube that was inserted during surgery.  Follow these instructions at home:  Only take over-the-counter or prescription medicines for pain, discomfort, or fever as directed by your health care provider.  Do not take aspirin. It can cause bleeding.  Do not drive when taking pain medicine.  Follow your health care provider's advice regarding diet, exercise, lifting, driving, and general activities.  Resume your usual diet as directed and allowed.  Get plenty of rest and sleep.  Do not douche, use tampons, or have sexual intercourse for at least 6 weeks, or until your health care provider gives you permission.  Change your bandages (dressings) as directed by your health care provider.  Monitor your temperature and notify your health care provider of a fever.  Take showers instead of baths for 2-3 weeks.  Do not drink alcohol until your health care provider gives you permission.  If you develop constipation, you may take a mild laxative with your health care provider's permission. Bran foods may help with constipation problems. Drinking enough fluids to keep your urine clear or pale yellow may help as well.  Try to have someone home with you for  1-2 weeks to help around the house.  Keep all of your follow-up appointments as directed by your health care provider. Contact a health care provider if:  You have swelling, redness, or increasing pain around your incision sites.  You have pus coming from your incision.  You notice a bad smell coming from your incision.  Your incision breaks open.  You feel dizzy or lightheaded.  You have pain or bleeding when you urinate.  You have persistent diarrhea.  You have persistent nausea and vomiting.  You have abnormal vaginal discharge.  You have a rash.  You have any type of abnormal reaction or develop an allergy to your medicine.  You have poor pain control with your prescribed medicine. Get help right away if:  You have a fever.  You have severe abdominal pain.  You have chest pain.  You have shortness of breath.  You faint.  You have pain, swelling, or redness in your leg.  You have heavy vaginal bleeding with blood clots. This information is not intended to replace advice given to you by your health care provider. Make sure you discuss any questions you have with your health care provider. Document Released: 03/05/2011 Document Revised: 08/22/2015 Document Reviewed: 09/29/2012 Elsevier Interactive Patient Education  2017 [employer].

## 2018-02-02 NOTE — H&P (View-Only) (Signed)
PREOPERATIVE HISTORY AND PHYSICAL  Date of surgery: 02/14/2018 Diagnosis: 1.  Endometriosis 2.  History of cervical dysplasia Procedure: LAVH, bilateral salpingectomy, possible USO or BSO   Patient is a 47 y.o. No obstetric history on file.female scheduled for LAVH bilateral salpingectomy on 02/14/2018 for management of symptomatic endometriosis and history of cervical dysplasia. Patient has history of cervical dysplasia; she is status post LEEP cone biopsy of the cervix with a positive endocervical margin; ECCs are unable to be performed because of postoperative cervical stenosis. The patient does have history of chronic lower abdominal pain; she is amenorrheic; she is status post endometrial ablation.  She does have history of uterine fibroids and past history of endometriosis. The patient is desiring to proceed with definitive surgery at this time; she would like to have ovarian conservation if no pathology is identified at the time of surgery.  Pertinent Gynecological History: History of endocervical dysplasia; status post LEEP cone biopsy in 2002; cervical stenosis precludes serial ECCs for monitoring of endocervical dysplasia History of hysteroscopy/D&C with NovaSure endometrial ablation in 2016  No LMP recorded. Patient has had an ablation.    Past Medical History:  Diagnosis Date  . Abdominal pain, LLQ   . Abnormal uterine bleeding   . Anemia   . Cervical dysplasia   . Endometriosis   . Fibroid   . Melanoma ([place])    RT SHOULDER  . Paratubal cyst   . Prediabetes     Past Surgical History:  Procedure Laterality Date  . DILATION AND CURETTAGE OF UTERUS    . HYSTEROSCOPY    . LAPAROSCOPY     DRAINAGE AND EXCISION OF PARATUBAL CYST  . LEEP  2002  . LEEP N/A 03/16/2016   Procedure: LOOP ELECTROSURGICAL EXCISION PROCEDURE (LEEP);  Surgeon: [name], MD;  Location: [facility] ORS;  Service: Gynecology;  Laterality: N/A;  . NOVASURE ABLATION  2016  . TONSILLECTOMY       OB History  No data available    Social History   Socioeconomic History  . Marital status: Married    Spouse name: Not on file  . Number of children: Not on file  . Years of education: Not on file  . Highest education level: Not on file  Occupational History  . Not on file  Social Needs  . Financial resource strain: Not on file  . Food insecurity:    Worry: Not on file    Inability: Not on file  . Transportation needs:    Medical: Not on file    Non-medical: Not on file  Tobacco Use  . Smoking status: Never Smoker  . Smokeless tobacco: Never Used  Substance and Sexual Activity  . Alcohol use: Yes    Comment: OCCAS  . Drug use: No  . Sexual activity: Yes    Birth control/protection: Surgical  Lifestyle  . Physical activity:    Days per week: Not on file    Minutes per session: Not on file  . Stress: Not on file  Relationships  . Social connections:    Talks on phone: Not on file    Gets together: Not on file    Attends religious service: Not on file    Active member of club or organization: Not on file    Attends meetings of clubs or organizations: Not on file    Relationship status: Not on file  Other Topics Concern  . Not on file  Social History Narrative  . Not on file  Family History  Problem Relation Age of Onset  . Uterine cancer Mother   . Cancer Neg Hx      (Not in a hospital admission)  Allergies  Allergen Reactions  . Aspirin Shortness Of Breath and Other (See Comments)    Inhale aspirin, no reaction to oral aspirin   . Eggs Or Egg-Derived Products Anaphylaxis  . Niacin Hives, Rash and Other (See Comments)    Whelps in a matter of seconds    Review of Systems Constitutional: No recent fever/chills/sweats Respiratory: No recent cough/bronchitis Cardiovascular: No chest pain Gastrointestinal: No recent nausea/vomiting/diarrhea Genitourinary: No UTI symptoms Hematologic/lymphatic:No history of coagulopathy or recent blood thinner  use    Objective:    BP 104/64   Pulse 66   Ht 5' (1.524 m)   Wt 147 lb 8 oz (66.9 kg)   BMI 28.81 kg/m   General:   Normal  Skin:   normal  HEENT:  Normal  Neck:  Supple without Adenopathy or Thyromegaly  Lungs:   Heart:              Breasts:   Abdomen:  Pelvis:  M/S   Extremeties:  Neuro:    clear to auscultation bilaterally   Normal without murmur   Not Examined   soft, non-tender; bowel sounds normal; no masses,  no organomegaly   Exam deferred to OR  No CVAT  Warm/Dry   Normal         02/02/2018: Pelvic exam-gynecoid, but with slight narrowing of the pelvic sidewalls  External genitalia-normal  BUS-normal  Vagina-normal estrogen effect  Cervix-stenosis of loss; no cervical motion tenderness  Uterus-midplane, normal size and shape, mobile, nontender  Adnexa-nonpalpable nontender Rectovaginal-normal external exam Assessment:    1.  Endometriosis 2.  History of cervical dysplasia status post LEEP cone biopsy with positive endocervical margin 3.  Cervical stenosis 4.  Pelvic pain 5.  Status post hysteroscopy/D&C with NovaSure endometrial ablation   Plan:  LAVH bilateral salpingectomy  Preop counseling: Patient is to undergo LAVH bilateral salpingectomy on 02/14/2018.  She is understanding of the planned procedures and is aware of and is accepting of all surgical risks which include but are not limited to bleeding, infection, pelvic organ injury with need for repair, blood clot disorders, anesthesia risk, etc.  All questions have been answered.  Informed consent is given.  Patient is very willing to proceed with surgery as scheduled.  [name], MD  Note: This dictation was prepared with Dragon dictation along with smaller phrase technology. Any transcriptional errors that result from this process are unintentional.

## 2018-02-07 ENCOUNTER — Other Ambulatory Visit: Payer: Self-pay

## 2018-02-07 ENCOUNTER — Encounter
Admission: RE | Admit: 2018-02-07 | Discharge: 2018-02-07 | Disposition: A | Discharge status: Home / Self Care | Payer: Commercial Managed Care - PPO | Source: Ambulatory Visit | Attending: Obstetrics and Gynecology | Admitting: Obstetrics and Gynecology

## 2018-02-07 ENCOUNTER — Inpatient Hospital Stay: Admission: RE | Admit: 2018-02-07 | Payer: Self-pay | Source: Ambulatory Visit

## 2018-02-07 ENCOUNTER — Ambulatory Visit
Admission: RE | Admit: 2018-02-07 | Discharge: 2018-02-07 | Disposition: A | Discharge status: Home / Self Care | Payer: Commercial Managed Care - PPO | Source: Ambulatory Visit | Attending: Obstetrics and Gynecology | Admitting: Obstetrics and Gynecology

## 2018-02-07 DIAGNOSIS — Z1231 Encounter for screening mammogram for malignant neoplasm of breast: Secondary | ICD-10-CM | POA: Diagnosis present

## 2018-02-07 HISTORY — DX: Pneumonia, unspecified organism: J18.9

## 2018-02-07 LAB — TYPE AND SCREEN
ABO/RH(D): B POS
Antibody Screen: NEGATIVE

## 2018-02-07 NOTE — Patient Instructions (Addendum)
  Your procedure is scheduled on: Monday February 14, 2018 Report to Same Day Surgery 2nd floor [facility] Entrance-take elevator on left to 2nd floor.  Check in with surgery information desk.) To find out your arrival time, call [phone] 1:00-3:00 PM on Friday February 11, 2018  Remember: Instructions that are not followed completely may result in serious medical risk, up to and including death, or upon the discretion of your surgeon and anesthesiologist your surgery may need to be rescheduled.    __x__ 1. Do not eat food (including mints, candies, chewing gum) after midnight the night before your procedure. You may drink clear liquids up to 2 hours before you are scheduled to arrive at the hospital for your procedure.  Do not drink anything within 2 hours of your scheduled arrival to the hospital.  Approved clear liquids:  --Water or Apple juice without pulp  --Clear carbohydrate beverage such as Gatorade or Powerade  --Black Coffee or Clear Tea (No milk, no creamers, do not add anything to the coffee or tea)    __x__ 2. No Alcohol for 24 hours before or after surgery.   __x__ 3. No Smoking or e-cigarettes for 24 hours before surgery.  Do not use any chewable tobacco products for at least 6 hours before surgery.   __x__ 4. Notify your doctor if there is any change in your medical condition (cold, fever, infections).   __x__ 5. On the morning of surgery brush your teeth with toothpaste and water.  You may rinse your mouth with mouthwash if you wish.  Do not swallow any toothpaste or mouthwash.  Please read over the following fact sheets that you were given:   [facility] Preparing for Surgery and/or MRSA Information    __x__ Use CHG Soap or Sage wipes as directed on instruction sheet.   Do not wear jewelry, make-up, hairpins, clips or nail polish on the day of surgery.  Do not wear lotions, powders, deodorant, or perfumes.   Do not shave below the face/neck 48  hours prior to surgery.   Do not bring valuables to the hospital.    [facility] is not responsible for any belongings or valuables.               Contacts, dentures or bridgework may not be worn into surgery.  Leave your suitcase in the car. After surgery it may be brought to your room.  For patients admitted to the hospital, discharge time is determined by your treatment team.  For patients discharged on the day of surgery, you will NOT be permitted to drive yourself home.  You must have a responsible adult with you for 24 hours after surgery.  __x__ Take anti-hypertensive listed below, cardiac, seizure, asthma, anti-reflux and psychiatric medicines. These include:  1. NONE  __x__ Follow recommendations from Cardiologist, Pulmonologist or PCP regarding stopping Aspirin, Coumadin, Plavix, Eliquis, Effient, Pradaxa, and Pletal.  __x__ TODAY: Stop Anti-inflammatories such as Advil, Ibuprofen, Motrin, Aleve, Naproxen, Naprosyn, BC/Goodies powders or aspirin products. You may continue to take Tylenol and Celebrex.   __x__ TODAY: Stop supplements until after surgery. You may continue to take Vitamin D, Vitamin B, and multivitamin.

## 2018-02-08 ENCOUNTER — Other Ambulatory Visit: Payer: Self-pay | Admitting: Obstetrics and Gynecology

## 2018-02-09 ENCOUNTER — Encounter
Admission: RE | Admit: 2018-02-09 | Discharge: 2018-02-09 | Disposition: A | Discharge status: Home / Self Care | Payer: Commercial Managed Care - PPO | Source: Ambulatory Visit | Attending: Obstetrics and Gynecology | Admitting: Obstetrics and Gynecology

## 2018-02-09 DIAGNOSIS — N870 Mild cervical dysplasia: Secondary | ICD-10-CM | POA: Insufficient documentation

## 2018-02-09 DIAGNOSIS — Z9889 Other specified postprocedural states: Secondary | ICD-10-CM | POA: Diagnosis not present

## 2018-02-09 DIAGNOSIS — N809 Endometriosis, unspecified: Secondary | ICD-10-CM | POA: Insufficient documentation

## 2018-02-09 DIAGNOSIS — N882 Stricture and stenosis of cervix uteri: Secondary | ICD-10-CM | POA: Diagnosis not present

## 2018-02-09 DIAGNOSIS — Z01818 Encounter for other preprocedural examination: Secondary | ICD-10-CM | POA: Diagnosis present

## 2018-02-09 LAB — RAPID HIV SCREEN (HIV 1/2 AB+AG)
HIV 1/2 ANTIBODIES: NONREACTIVE
HIV-1 P24 Antigen - HIV24: NONREACTIVE

## 2018-02-10 LAB — RPR: RPR: NONREACTIVE

## 2018-02-13 MED ORDER — CEFAZOLIN SODIUM-DEXTROSE 2-4 GM/100ML-% IV SOLN
2.0000 g | INTRAVENOUS | Status: AC
  Administered 2018-02-14: 2 g via INTRAVENOUS

## 2018-02-14 ENCOUNTER — Ambulatory Visit: Payer: Commercial Managed Care - PPO | Admitting: Certified Registered"

## 2018-02-14 ENCOUNTER — Encounter
Admission: RE | Disposition: A | Discharge status: Home / Self Care | Payer: Self-pay | Source: Ambulatory Visit | Attending: Obstetrics and Gynecology

## 2018-02-14 ENCOUNTER — Other Ambulatory Visit: Payer: Self-pay

## 2018-02-14 ENCOUNTER — Observation Stay
Admission: RE | Admit: 2018-02-14 | Discharge: 2018-02-15 | Disposition: A | Discharge status: Home / Self Care | Payer: Commercial Managed Care - PPO | Source: Ambulatory Visit | Attending: Obstetrics and Gynecology | Admitting: Obstetrics and Gynecology

## 2018-02-14 DIAGNOSIS — R102 Pelvic and perineal pain: Secondary | ICD-10-CM | POA: Insufficient documentation

## 2018-02-14 DIAGNOSIS — Z90710 Acquired absence of both cervix and uterus: Secondary | ICD-10-CM

## 2018-02-14 DIAGNOSIS — G894 Chronic pain syndrome: Secondary | ICD-10-CM

## 2018-02-14 DIAGNOSIS — Z85820 Personal history of malignant melanoma of skin: Secondary | ICD-10-CM | POA: Diagnosis not present

## 2018-02-14 DIAGNOSIS — R1032 Left lower quadrant pain: Secondary | ICD-10-CM

## 2018-02-14 DIAGNOSIS — N809 Endometriosis, unspecified: Secondary | ICD-10-CM | POA: Diagnosis present

## 2018-02-14 DIAGNOSIS — G8929 Other chronic pain: Secondary | ICD-10-CM | POA: Diagnosis not present

## 2018-02-14 DIAGNOSIS — D251 Intramural leiomyoma of uterus: Principal | ICD-10-CM | POA: Insufficient documentation

## 2018-02-14 DIAGNOSIS — N83202 Unspecified ovarian cyst, left side: Secondary | ICD-10-CM | POA: Insufficient documentation

## 2018-02-14 DIAGNOSIS — Z87410 Personal history of cervical dysplasia: Secondary | ICD-10-CM | POA: Diagnosis not present

## 2018-02-14 DIAGNOSIS — Z90721 Acquired absence of ovaries, unilateral: Secondary | ICD-10-CM

## 2018-02-14 DIAGNOSIS — N870 Mild cervical dysplasia: Secondary | ICD-10-CM

## 2018-02-14 DIAGNOSIS — Z888 Allergy status to other drugs, medicaments and biological substances status: Secondary | ICD-10-CM | POA: Diagnosis not present

## 2018-02-14 DIAGNOSIS — Z8049 Family history of malignant neoplasm of other genital organs: Secondary | ICD-10-CM | POA: Diagnosis not present

## 2018-02-14 DIAGNOSIS — D649 Anemia, unspecified: Secondary | ICD-10-CM | POA: Insufficient documentation

## 2018-02-14 DIAGNOSIS — N838 Other noninflammatory disorders of ovary, fallopian tube and broad ligament: Secondary | ICD-10-CM | POA: Insufficient documentation

## 2018-02-14 DIAGNOSIS — R7303 Prediabetes: Secondary | ICD-10-CM | POA: Insufficient documentation

## 2018-02-14 DIAGNOSIS — Z886 Allergy status to analgesic agent status: Secondary | ICD-10-CM | POA: Insufficient documentation

## 2018-02-14 DIAGNOSIS — Z91012 Allergy to eggs: Secondary | ICD-10-CM | POA: Diagnosis not present

## 2018-02-14 HISTORY — PX: LAPAROSCOPIC ASSISTED VAGINAL HYSTERECTOMY: SHX5398

## 2018-02-14 HISTORY — PX: LAPAROSCOPIC BILATERAL SALPINGECTOMY: SHX5889

## 2018-02-14 HISTORY — PX: CYSTOSCOPY: SHX5120

## 2018-02-14 LAB — CBC
HCT: 40.7 % (ref 36.0–46.0)
HEMOGLOBIN: 13 g/dL (ref 12.0–15.0)
MCH: 28.9 pg (ref 26.0–34.0)
MCHC: 31.9 g/dL (ref 30.0–36.0)
MCV: 90.4 fL (ref 80.0–100.0)
PLATELETS: 289 10*3/uL (ref 150–400)
RBC: 4.5 MIL/uL (ref 3.87–5.11)
RDW: 13.1 % (ref 11.5–15.5)
WBC: 15.1 10*3/uL — AB (ref 4.0–10.5)
nRBC: 0 % (ref 0.0–0.2)

## 2018-02-14 LAB — POCT PREGNANCY, URINE: PREG TEST UR: NEGATIVE

## 2018-02-14 SURGERY — HYSTERECTOMY, VAGINAL, LAPAROSCOPY-ASSISTED
Anesthesia: General

## 2018-02-14 MED ORDER — MIDAZOLAM HCL 2 MG/2ML IJ SOLN
INTRAMUSCULAR | Status: AC
  Filled 2018-02-14: qty 2

## 2018-02-14 MED ORDER — PHENYLEPHRINE HCL 10 MG/ML IJ SOLN
INTRAMUSCULAR | Status: AC
  Filled 2018-02-14: qty 1

## 2018-02-14 MED ORDER — PROPOFOL 10 MG/ML IV BOLUS
INTRAVENOUS | Status: DC | PRN
  Administered 2018-02-14: 150 mg via INTRAVENOUS

## 2018-02-14 MED ORDER — SUGAMMADEX SODIUM 200 MG/2ML IV SOLN
INTRAVENOUS | Status: DC | PRN
  Administered 2018-02-14: 200 mg via INTRAVENOUS

## 2018-02-14 MED ORDER — CEFAZOLIN SODIUM-DEXTROSE 2-4 GM/100ML-% IV SOLN
INTRAVENOUS | Status: AC
  Filled 2018-02-14: qty 100

## 2018-02-14 MED ORDER — DEXTROSE IN LACTATED RINGERS 5 % IV SOLN
INTRAVENOUS | Status: DC
  Administered 2018-02-14: 12:00:00 via INTRAVENOUS

## 2018-02-14 MED ORDER — ROCURONIUM BROMIDE 50 MG/5ML IV SOLN
INTRAVENOUS | Status: AC
  Filled 2018-02-14: qty 1

## 2018-02-14 MED ORDER — PHENYLEPHRINE HCL 10 MG/ML IJ SOLN
INTRAMUSCULAR | Status: DC | PRN
  Administered 2018-02-14 (×7): 50 ug via INTRAVENOUS
  Administered 2018-02-14: 100 ug via INTRAVENOUS

## 2018-02-14 MED ORDER — MIDAZOLAM HCL 2 MG/2ML IJ SOLN
INTRAMUSCULAR | Status: DC | PRN
  Administered 2018-02-14: 2 mg via INTRAVENOUS

## 2018-02-14 MED ORDER — SEVOFLURANE IN SOLN
RESPIRATORY_TRACT | Status: AC
  Filled 2018-02-14: qty 250

## 2018-02-14 MED ORDER — PROPOFOL 10 MG/ML IV BOLUS
INTRAVENOUS | Status: AC
  Filled 2018-02-14: qty 40

## 2018-02-14 MED ORDER — ROCURONIUM BROMIDE 100 MG/10ML IV SOLN
INTRAVENOUS | Status: DC | PRN
  Administered 2018-02-14: 40 mg via INTRAVENOUS
  Administered 2018-02-14 (×2): 10 mg via INTRAVENOUS

## 2018-02-14 MED ORDER — GLYCOPYRROLATE 0.2 MG/ML IJ SOLN
INTRAMUSCULAR | Status: AC
  Filled 2018-02-14: qty 1

## 2018-02-14 MED ORDER — FENTANYL CITRATE (PF) 250 MCG/5ML IJ SOLN
INTRAMUSCULAR | Status: AC
  Filled 2018-02-14: qty 5

## 2018-02-14 MED ORDER — DEXAMETHASONE SODIUM PHOSPHATE 10 MG/ML IJ SOLN
INTRAMUSCULAR | Status: AC
  Filled 2018-02-14: qty 1

## 2018-02-14 MED ORDER — LIDOCAINE 5 % EX PTCH
MEDICATED_PATCH | CUTANEOUS | Status: AC
  Filled 2018-02-14: qty 1

## 2018-02-14 MED ORDER — LIDOCAINE HCL (PF) 2 % IJ SOLN
INTRAMUSCULAR | Status: AC
  Filled 2018-02-14: qty 10

## 2018-02-14 MED ORDER — FENTANYL CITRATE (PF) 100 MCG/2ML IJ SOLN
25.0000 ug | INTRAMUSCULAR | Status: DC | PRN
  Administered 2018-02-14: 25 ug via INTRAVENOUS

## 2018-02-14 MED ORDER — FAMOTIDINE 20 MG PO TABS
20.0000 mg | ORAL_TABLET | Freq: Once | ORAL | Status: AC
  Administered 2018-02-14: 20 mg via ORAL

## 2018-02-14 MED ORDER — ONDANSETRON HCL 4 MG/2ML IJ SOLN
INTRAMUSCULAR | Status: DC | PRN
  Administered 2018-02-14: 4 mg via INTRAVENOUS

## 2018-02-14 MED ORDER — FLUORESCEIN SODIUM 10 % IV SOLN
INTRAVENOUS | Status: DC | PRN
  Administered 2018-02-14: .5 mL via INTRAVENOUS

## 2018-02-14 MED ORDER — ONDANSETRON HCL 4 MG/2ML IJ SOLN: 4.0000 mg | Freq: Once | INTRAMUSCULAR | Status: DC | PRN

## 2018-02-14 MED ORDER — HYDROMORPHONE HCL 2 MG PO TABS
2.0000 mg | ORAL_TABLET | ORAL | Status: DC | PRN
  Administered 2018-02-14 (×3): 2 mg via ORAL
  Filled 2018-02-14 (×3): qty 1

## 2018-02-14 MED ORDER — ACETAMINOPHEN 500 MG PO TABS
1000.0000 mg | ORAL_TABLET | Freq: Four times a day (QID) | ORAL | Status: DC
  Administered 2018-02-14 – 2018-02-15 (×3): 1000 mg via ORAL
  Filled 2018-02-14 (×4): qty 2

## 2018-02-14 MED ORDER — FENTANYL CITRATE (PF) 100 MCG/2ML IJ SOLN
INTRAMUSCULAR | Status: AC
  Administered 2018-02-14: 25 ug via INTRAVENOUS
  Filled 2018-02-14: qty 2

## 2018-02-14 MED ORDER — FENTANYL CITRATE (PF) 100 MCG/2ML IJ SOLN
INTRAMUSCULAR | Status: DC | PRN
  Administered 2018-02-14: 50 ug via INTRAVENOUS
  Administered 2018-02-14 (×3): 25 ug via INTRAVENOUS

## 2018-02-14 MED ORDER — DEXAMETHASONE SODIUM PHOSPHATE 10 MG/ML IJ SOLN
INTRAMUSCULAR | Status: DC | PRN
  Administered 2018-02-14: 10 mg via INTRAVENOUS

## 2018-02-14 MED ORDER — ONDANSETRON HCL 4 MG/2ML IJ SOLN
INTRAMUSCULAR | Status: AC
  Filled 2018-02-14: qty 2

## 2018-02-14 MED ORDER — LACTATED RINGERS IV SOLN
INTRAVENOUS | Status: DC
  Administered 2018-02-14 (×2): via INTRAVENOUS

## 2018-02-14 MED ORDER — FLUORESCEIN SODIUM 10 % IV SOLN
INTRAVENOUS | Status: AC
  Filled 2018-02-14: qty 5

## 2018-02-14 MED ORDER — FAMOTIDINE 20 MG PO TABS
ORAL_TABLET | ORAL | Status: AC
  Administered 2018-02-14: 20 mg via ORAL
  Filled 2018-02-14: qty 1

## 2018-02-14 MED ORDER — SUGAMMADEX SODIUM 200 MG/2ML IV SOLN
INTRAVENOUS | Status: AC
  Filled 2018-02-14: qty 2

## 2018-02-14 MED ORDER — LIDOCAINE HCL (CARDIAC) PF 100 MG/5ML IV SOSY
PREFILLED_SYRINGE | INTRAVENOUS | Status: DC | PRN
  Administered 2018-02-14: 100 mg via INTRAVENOUS

## 2018-02-14 MED ORDER — GLYCOPYRROLATE 0.2 MG/ML IJ SOLN
INTRAMUSCULAR | Status: DC | PRN
  Administered 2018-02-14: 0.2 mg via INTRAVENOUS

## 2018-02-14 SURGICAL SUPPLY — 64 items
BAG URINE DRAINAGE (UROLOGICAL SUPPLIES) ×4 IMPLANT
BLADE SURG SZ11 CARB STEEL (BLADE) ×4 IMPLANT
CANISTER SUCT 1200ML W/VALVE (MISCELLANEOUS) ×4 IMPLANT
CATH FOLEY 2WAY  5CC 16FR (CATHETERS) ×1
CATH URTH 16FR FL 2W BLN LF (CATHETERS) ×3 IMPLANT
CHLORAPREP W/TINT 26ML (MISCELLANEOUS) ×4 IMPLANT
CORD MONOPOLAR M/FML 12FT (MISCELLANEOUS) ×4 IMPLANT
COVER WAND RF STERILE (DRAPES) ×4 IMPLANT
DERMABOND ADVANCED (GAUZE/BANDAGES/DRESSINGS) ×1
DERMABOND ADVANCED .7 DNX12 (GAUZE/BANDAGES/DRESSINGS) ×3 IMPLANT
DRAPE LAPAROTOMY 100X77 ABD (DRAPES) IMPLANT
DRAPE LAPAROTOMY TRNSV 106X77 (MISCELLANEOUS) IMPLANT
DRSG TEGADERM 2-3/8X2-3/4 SM (GAUZE/BANDAGES/DRESSINGS) ×12 IMPLANT
DRSG TELFA 3X8 NADH (GAUZE/BANDAGES/DRESSINGS) IMPLANT
ELECT BLADE 6 FLAT ULTRCLN (ELECTRODE) IMPLANT
ELECT BLADE 6.5 EXT (BLADE) ×4 IMPLANT
ELECT CAUTERY BLADE 6.4 (BLADE) IMPLANT
ELECT REM PT RETURN 9FT ADLT (ELECTROSURGICAL) ×4
ELECTRODE REM PT RTRN 9FT ADLT (ELECTROSURGICAL) ×3 IMPLANT
GAUZE PETRO XEROFOAM 1X8 (MISCELLANEOUS) ×4 IMPLANT
GAUZE SPONGE 4X4 12PLY STRL (GAUZE/BANDAGES/DRESSINGS) IMPLANT
GLOVE BIO SURGEON STRL SZ 6.5 (GLOVE) ×8 IMPLANT
GLOVE BIO SURGEON STRL SZ8 (GLOVE) ×40 IMPLANT
GLOVE INDICATOR 7.0 STRL GRN (GLOVE) ×4 IMPLANT
GLOVE INDICATOR 8.0 STRL GRN (GLOVE) ×4 IMPLANT
GOWN STRL REUS W/ TWL LRG LVL3 (GOWN DISPOSABLE) ×6 IMPLANT
GOWN STRL REUS W/ TWL XL LVL3 (GOWN DISPOSABLE) ×3 IMPLANT
GOWN STRL REUS W/TWL LRG LVL3 (GOWN DISPOSABLE) ×2
GOWN STRL REUS W/TWL XL LVL3 (GOWN DISPOSABLE) ×1
IRRIGATION STRYKERFLOW (MISCELLANEOUS) ×3 IMPLANT
IRRIGATOR STRYKERFLOW (MISCELLANEOUS) ×4
IV LACTATED RINGERS 1000ML (IV SOLUTION) ×4 IMPLANT
KIT PINK PAD W/HEAD ARE REST (MISCELLANEOUS) ×4
KIT PINK PAD W/HEAD ARM REST (MISCELLANEOUS) ×3 IMPLANT
KIT TURNOVER CYSTO (KITS) ×4 IMPLANT
KIT TURNOVER KIT A (KITS) ×4 IMPLANT
LABEL OR SOLS (LABEL) ×4 IMPLANT
LIGASURE IMPACT 36 18CM CVD LR (INSTRUMENTS) IMPLANT
PACK BASIN MAJOR ARMC (MISCELLANEOUS) IMPLANT
PACK BASIN MINOR ARMC (MISCELLANEOUS) ×4 IMPLANT
PACK GYN LAPAROSCOPIC (MISCELLANEOUS) ×4 IMPLANT
PAD OB MATERNITY 4.3X12.25 (PERSONAL CARE ITEMS) ×4 IMPLANT
PENCIL ELECTRO HAND CTR (MISCELLANEOUS) ×4 IMPLANT
SCISSORS METZENBAUM CVD 33 (INSTRUMENTS) IMPLANT
SHEARS HARMONIC ACE PLUS 36CM (ENDOMECHANICALS) ×4 IMPLANT
SLEEVE ENDOPATH XCEL 5M (ENDOMECHANICALS) ×8 IMPLANT
STAPLER SKIN PROX 35W (STAPLE) IMPLANT
SUT CHROMIC 0 CT 1 (SUTURE) IMPLANT
SUT CHROMIC 2 0 CT 1 (SUTURE) ×12 IMPLANT
SUT MAXON ABS #0 GS21 30IN (SUTURE) IMPLANT
SUT MNCRL 4-0 (SUTURE) ×1
SUT MNCRL 4-0 27XMFL (SUTURE) ×3
SUT SILK 0 (SUTURE)
SUT SILK 0 30XBRD TIE 6 (SUTURE) IMPLANT
SUT VIC AB 0 CT1 27 (SUTURE) ×1
SUT VIC AB 0 CT1 27XCR 8 STRN (SUTURE) ×3 IMPLANT
SUT VIC AB 0 CT1 36 (SUTURE) ×8 IMPLANT
SUTURE MNCRL 4-0 27XMF (SUTURE) ×3 IMPLANT
SYR 10ML LL (SYRINGE) ×4 IMPLANT
TAPE TRANSPORE STRL 2 31045 (GAUZE/BANDAGES/DRESSINGS) ×4 IMPLANT
TRAY FOLEY MTR SLVR 16FR STAT (SET/KITS/TRAYS/PACK) ×4 IMPLANT
TROCAR XCEL NON-BLD 5MMX100MML (ENDOMECHANICALS) ×4 IMPLANT
TUBING INSUF HEATED (TUBING) ×4 IMPLANT
WATER STERILE IRR 1000ML POUR (IV SOLUTION) IMPLANT

## 2018-02-14 NOTE — Anesthesia Procedure Notes (Signed)
Procedure Name: Intubation Date/Time: 02/14/2018 7:47 AM Performed by: Lavone Orn, CRNA Pre-anesthesia Checklist: Patient identified, Emergency Drugs available, Suction available, Patient being monitored and Timeout performed Patient Re-evaluated:Patient Re-evaluated prior to induction Oxygen Delivery Method: Circle system utilized Preoxygenation: Pre-oxygenation with 100% oxygen Induction Type: IV induction and Cricoid Pressure applied Ventilation: Mask ventilation without difficulty Laryngoscope Size: Mac and 3 Grade View: Grade II Tube type: Oral Number of attempts: 1 Airway Equipment and Method: Stylet Placement Confirmation: ETT inserted through vocal cords under direct vision,  positive ETCO2 and breath sounds checked- equal and bilateral Secured at: 21 cm Tube secured with: Tape Dental Injury: Teeth and Oropharynx as per pre-operative assessment

## 2018-02-14 NOTE — Anesthesia Postprocedure Evaluation (Signed)
Anesthesia Post Note  Patient: Tracy Madden  Procedure(s) Performed: LAPAROSCOPIC ASSISTED VAGINAL HYSTERECTOMY (N/A ) LAPAROSCOPIC BILATERAL SALPINGECTOMY (Bilateral ) LAPAROSCOPIC OOPHORECTOMY (Left ) CYSTOSCOPY (Bilateral )  Patient location during evaluation: PACU Anesthesia Type: General Level of consciousness: awake and alert and oriented Pain management: pain level controlled Vital Signs Assessment: post-procedure vital signs reviewed and stable Respiratory status: spontaneous breathing Cardiovascular status: blood pressure returned to baseline Anesthetic complications: no     Last Vitals:  Vitals:   02/14/18 1104 02/14/18 1141  BP: (!) 90/52 (!) 101/58  Pulse: (!) 59 69  Resp: (!) 22 20  Temp: 36.8 C 36.9 C  SpO2: 98% 97%    Last Pain:  Vitals:   02/14/18 1152  TempSrc:   PainSc: 6                  Elsie Baynes

## 2018-02-14 NOTE — Interval H&P Note (Signed)
History and Physical Interval Note:  02/14/2018 7:29 AM  Tracy Madden  has presented today for surgery, with the diagnosis of DYSPLASIA OF CERVIX,LOW GRADE  The various methods of treatment have been discussed with the patient and family. After consideration of risks, benefits and other options for treatment, the patient has consented to  Procedure(s): LAPAROSCOPIC ASSISTED VAGINAL HYSTERECTOMY (N/A) HYSTERECTOMY ABDOMINAL (N/A) as a surgical intervention .  The patient's history has been reviewed, patient examined, no change in status, stable for surgery.  I have reviewed the patient's chart and labs.  Questions were answered to the patient's satisfaction.     Hassell Done A Bethenny Losee

## 2018-02-14 NOTE — Anesthesia Post-op Follow-up Note (Signed)
Anesthesia QCDR form completed.        

## 2018-02-14 NOTE — Op Note (Signed)
OPERATIVE NOTE:  [name] PROCEDURE DATE: 02/14/2018   PREOPERATIVE DIAGNOSIS: 1.  History of cervical dysplasia status post LEEP cone biopsy 2.  History of endometriosis 3.  Chronic pelvic pain, midline and left lower quadrant  POSTOPERATIVE DIAGNOSIS: 1.  History of cervical dysplasia status post LEEP cone biopsy 2.  History of endometriosis 3.  Chronic pelvic pain, midline and left lower quadrant 4.  Left ovarian cyst  PROCEDURE: LAVH bilateral salpingectomy and left oophorectomy; Cystoscopy SURGEON:  [name], MD ASSISTANTS: Dr. Marcelline Mates ANESTHESIA: General INDICATIONS: 47 y.o. female who presents for surgical management of symptomatic endometriosis and history of cervical dysplasia.  FINDINGS: Grossly normal-appearing uterus right ovary normal; Lt ovary  with simple cyst.  No gross stigmata of endometriosis seen   I/O's: Total I/O In: 1000 [I.V.:1000] Out: 525 [Urine:400; Blood:125] COUNTS:  YES SPECIMENS: Uterus with cervix, bilateral fallopian tubes and ovary ANTIBIOTIC PROPHYLAXIS:Ancef 2 grams COMPLICATIONS: None immediate  PROCEDURE IN DETAIL: Patient was brought to operating room placed in supine position.  General endotracheal anesthesia was induced that difficulty.  She was placed in the dorsolithotomy position using the bumblebee stirrups.  A ChloraPrep and Hibiclens abdominal perineal and intravaginal prep and drape was performed in standard fashion. Timeout was completed. Pelvic exam was notable for cervical stenosis and a double-tooth tenaculum was placed onto the cervix, as the endocervical canal could not be cannulated. Foley catheter was placed and was draining clear yellow urine. Subumbilical vertical incision 5 mm in length was made.  The Optiview laparoscopic trocar system was used to place a 5 mm port directly into the abdominal pelvic cavity without evidence of bowel or vascular injury.  2 other 5 mm ports in the right and left lower  quadrants were placed respectively under direct visualization. Photodocumentation was made of the pelvic anatomy.  Hysterectomy was then performed in standard fashion.  The Ace harmonic scalpel was used along with graspers to help facilitate the procedure.  The right mesosalpinx was taken down with harmonic scalpel.  The right Infidibulopelvic ligament was taken down with the harmonic scalpel.  The cardinal broad ligament complexes were then dissected with the Ace harmonic scalpel down to the level of the uterosacral ligament on the right.  The broad ligament was skeletonized and exposed the uterine artery.  Uterine artery was cauterized with Kleppinger bipolar forceps and transected.  The bladder flap was taken down over the lower uterine segment with the Ace harmonic scalpel. On the left side the decision was made to remove the ovary as well as the tube.  The Infidibulopelvic Ligament was taken down with the Ace harmonic scalpel.  The mesosalpinx was also taken down with the Ace harmonic scalpel. The cardinal broad ligament complexes were taken down with harmonic scalpel.  The anterior leaf the broad ligament was opened tio complete the bladder flap dissection.  The uterine artery was again exposed on the left and then cauterized using the Kleppinger bipolar forceps.  It was then transected with the harmonic scalpel. Vaginal aspect of the procedure was then performed.  Weighted speculum is placed in the vagina.  Posterior colpotomy was made with Mayo scissors.  Uterosacral ligaments were clamped cut and stick tied using 0 Vicryl suture.  The remainder of the cardinal broad ligament complex was also taken down with a clamping and cutting and stick tying technique.  This mobilized the specimen which was removed from the operative field. The posterior cuff was closed using a running locking stitch of 0 Vicryl.  Culdoplasty was performed with 0 Vicryl bringing the uterosacral ligaments together centrally.  The  vagina was then closed using simple interrupted sutures of 2-0 chromic. Follow-up laparoscopy verified adequate hemostasis. Incisions were closed with 4.0 vicryl simple interrupted sutures. Dermabond glue was place over the incisions. The ureters could not clearly be visualized with the laparoscope; decision was made to proceed with cystoscopy to confirm ureteral function. Foley catheter was removed.  The 30 degree cystoscope was then used to  visualize bladder mucosa and ureteral orifices.  0.5 cc of flourscein was given intravenously to help visualize the ureteral jets.  Cystoscopy confirmed normal function of the ureters with eflux bilaterally. Following completion of the cystoscopy, the patient was mobilized awakened and taken recovery in satisfactory condition.  Estimated blood loss was 125 cc.  Complications were none.  Nimco Bivens A. Zipporah Plants, MD, ACOG [facility]

## 2018-02-14 NOTE — Transfer of Care (Signed)
Immediate Anesthesia Transfer of Care Note  Patient: Tracy Madden  Procedure(s) Performed: LAPAROSCOPIC ASSISTED VAGINAL HYSTERECTOMY (N/A ) LAPAROSCOPIC BILATERAL SALPINGECTOMY (Bilateral ) LAPAROSCOPIC OOPHORECTOMY (Left ) CYSTOSCOPY (Bilateral )  Patient Location: PACU  Anesthesia Type:General  Level of Consciousness: awake and alert   Airway & Oxygen Therapy: Patient Spontanous Breathing  Post-op Assessment: Report given to RN and Post -op Vital signs reviewed and stable  Post vital signs: Reviewed  Last Vitals:  Vitals Value Taken Time  BP 106/68 02/14/2018 10:01 AM  Temp 36.3 C 02/14/2018 10:01 AM  Pulse 73 02/14/2018 10:01 AM  Resp 14 02/14/2018 10:01 AM  SpO2 96 % 02/14/2018 10:01 AM  Vitals shown include unvalidated device data.  Last Pain:  Vitals:   02/14/18 0655  TempSrc: Tympanic  PainSc:          Complications: No apparent anesthesia complications

## 2018-02-14 NOTE — Anesthesia Preprocedure Evaluation (Addendum)
Anesthesia Evaluation  Patient identified by MRN, date of birth, ID band Patient awake    Reviewed: Allergy & Precautions, NPO status , Patient's Chart, lab work & pertinent test results, reviewed documented beta blocker date and time   Airway Mallampati: II  TM Distance: >3 FB     Dental  (+) Chipped   Pulmonary pneumonia, resolved,    Pulmonary exam normal        Cardiovascular negative cardio ROS Normal cardiovascular exam     Neuro/Psych    GI/Hepatic negative GI ROS, Neg liver ROS,   Endo/Other    Renal/GU negative Renal ROS  Female GU complaint     Musculoskeletal negative musculoskeletal ROS (+)   Abdominal Normal abdominal exam  (+)   Peds negative pediatric ROS (+)  Hematology  (+) anemia ,   Anesthesia Other Findings Patient can eat eggs and propofol was used in past without issue.  Reproductive/Obstetrics                            Anesthesia Physical  Anesthesia Plan  ASA: II  Anesthesia Plan: General   Post-op Pain Management:    Induction: Intravenous  PONV Risk Score and Plan:   Airway Management Planned: Oral ETT  Additional Equipment:   Intra-op Plan:   Post-operative Plan: Extubation in OR  Informed Consent: I have reviewed the patients History and Physical, chart, labs and discussed the procedure including the risks, benefits and alternatives for the proposed anesthesia with the patient or authorized representative who has indicated his/her understanding and acceptance.     Plan Discussed with: CRNA  Anesthesia Plan Comments:         Anesthesia Quick Evaluation

## 2018-02-15 ENCOUNTER — Encounter: Payer: Self-pay | Admitting: Obstetrics and Gynecology

## 2018-02-15 DIAGNOSIS — D251 Intramural leiomyoma of uterus: Secondary | ICD-10-CM | POA: Diagnosis not present

## 2018-02-15 LAB — SURGICAL PATHOLOGY

## 2018-02-15 MED ORDER — HYDROMORPHONE HCL 2 MG PO TABS: 2.0000 mg | ORAL_TABLET | ORAL | 0 refills | Status: DC | PRN

## 2018-02-15 NOTE — Progress Notes (Signed)
Provided and reviewed discharge paperwork and prescriptions. Teach back method was utilized, pt also verbalized understanding of information provided. Follow up appointment provided (Tuesday, December 3 as [facility] reported Dr. Enzo Bi would not be in the office Monday, December 2). Patient's husband to transport the patient home. Awaiting his ride now. Will continue to monitor.

## 2018-02-15 NOTE — Plan of Care (Signed)
Vs stable; up ad lib; ambulated in hallway well; tolerating a regular diet; taking PO dilaudid and PO tylenol for pain control; iv unable to be flushed around 0400 so it was removed

## 2018-02-15 NOTE — Discharge Summary (Signed)
Physician Discharge Summary  Patient ID: Tracy Madden MRN: [id number] DOB/AGE: 1971-02-07 47 y.o.  Admit date: 02/14/2018 Discharge date: 02/15/2018  Admission Diagnoses: 1.  Symptomatic endometriosis 2.  History of cervical dysplasia, status post LEEP cone biopsy  Discharge Diagnoses:  Active Problems:   Status post LAVH bilateral salpingectomy left oophorectomy 1.  Symptomatic endometriosis 2.  History of cervical dysplasia, status post LEEP cone biopsy 3.  Left ovarian cyst  Discharged Condition: good  Hospital Course: Uncomplicated  Consults: None  Significant Diagnostic Studies: labs:  CBC Latest Ref Rng & Units 02/14/2018 03/12/2016 05/03/2014  WBC 4.0 - 10.5 K/uL 15.1(H) 6.0 -  Hemoglobin 12.0 - 15.0 g/dL 13.0 13.7 9.5(L)  Hematocrit 36.0 - 46.0 % 40.7 40.4 -  Platelets 150 - 400 K/uL 289 287 -    Treatments: surgery: LAVH bilateral salpingectomy left oophorectomy  Discharge Exam: Blood pressure 113/71, pulse 65, temperature 98.4 F (36.9 C), temperature source Oral, resp. rate 20, height 5\' 3"  (1.6 m), weight 67.3 kg, SpO2 100 %. General appearance: alert, cooperative and no distress GI: soft, non-tender; bowel sounds normal; no masses,  no organomegaly Extremities: extremities normal, atraumatic, no cyanosis or edema Skin: Skin color, texture, turgor normal. No rashes or lesions Incision/Wound: Laparoscopy ports covered with Dermabond without evidence of infection or hernia  Disposition: Discharge disposition: 01-Home or Self Care       Discharge Instructions    Discharge patient   Complete by:  As directed    Make PO Check appt for 02/28/2018   Discharge disposition:  01-Home or Self Care   Discharge patient date:  02/15/2018     Allergies as of 02/15/2018      Reactions   Aspirin Shortness Of Breath, Other (See Comments)   Inhale aspirin, no reaction to oral aspirin    Eggs Or Egg-derived Products Anaphylaxis   Niacin Hives, Rash, Other  (See Comments)   Whelps in a matter of seconds      Medication List    TAKE these medications   ASHWAGANDHA PO Take 1 capsule by mouth daily as needed (for anxiety).   HYDROmorphone 2 MG tablet Commonly known as:  DILAUDID Take 1 tablet (2 mg total) by mouth every 4 (four) hours as needed for moderate pain or severe pain.   OVER THE COUNTER MEDICATION Take 1 tablet by mouth daily. Kidney Detox Supplement   OVER THE COUNTER MEDICATION Take 1 tablet by mouth daily. Liver Detox Supplement      Follow-up Information    Lanard Arguijo, Alanda Slim, MD. Schedule an appointment as soon as possible for a visit in 13 day(s).   Specialties:  Obstetrics and Gynecology, Radiology Why:  Post Op Check Contact information: [address] [place] [address] [phone]           Signed: Alanda Slim Emmy Keng 02/15/2018, 8:24 AM

## 2018-02-16 NOTE — Op Note (Signed)
OPERATIVE NOTE:  [name] PROCEDURE DATE: 02/16/2018   ADDENDUM: Due to the complex nature of the surgical procedure, the lack of availability of a qualified first assistant, the procedure was performed with the assistance of Dr. Marcelline Mates.  Camari Quintanilla A. Zipporah Plants, MD, ACOG [facility]

## 2018-03-01 ENCOUNTER — Encounter: Payer: Commercial Managed Care - PPO | Admitting: Obstetrics and Gynecology

## 2018-03-01 ENCOUNTER — Encounter: Payer: Self-pay | Admitting: Obstetrics and Gynecology

## 2018-03-01 ENCOUNTER — Ambulatory Visit (INDEPENDENT_AMBULATORY_CARE_PROVIDER_SITE_OTHER): Payer: Commercial Managed Care - PPO | Admitting: Obstetrics and Gynecology

## 2018-03-01 ENCOUNTER — Other Ambulatory Visit: Payer: Self-pay | Admitting: Family Medicine

## 2018-03-01 VITALS — BP 116/75 | HR 89 | Ht 60.0 in | Wt 150.6 lb

## 2018-03-01 DIAGNOSIS — R109 Unspecified abdominal pain: Secondary | ICD-10-CM

## 2018-03-01 DIAGNOSIS — Z90710 Acquired absence of both cervix and uterus: Secondary | ICD-10-CM

## 2018-03-01 DIAGNOSIS — R300 Dysuria: Secondary | ICD-10-CM

## 2018-03-01 DIAGNOSIS — Z09 Encounter for follow-up examination after completed treatment for conditions other than malignant neoplasm: Secondary | ICD-10-CM

## 2018-03-01 DIAGNOSIS — Z90721 Acquired absence of ovaries, unilateral: Secondary | ICD-10-CM

## 2018-03-01 DIAGNOSIS — Z87448 Personal history of other diseases of urinary system: Secondary | ICD-10-CM

## 2018-03-01 NOTE — Patient Instructions (Signed)
1.  Return on 03/17/2018 for final postop check 2.  May return to work full-time with the exception of lifting nothing heavier than 10 pounds for the next 3 weeks. 3.  Continue with routine postop precautions.

## 2018-03-01 NOTE — Progress Notes (Signed)
Chief complaint: 1.  2-week postop check 2.  Status post LAVH bilateral salpingectomy left oophorectomy  Tracy Madden presents for her first postop check.  She is doing well with normal bowel and bladder function.  She is complaining of some dysuria; however she, she did dip her urine at work and it was negative.  I have recommended that we check a urinalysis and urine culture to confirm no evidence of postop UTI.  She is experiencing minimal vaginal discharge/spotting.  She is not taking any analgesic therapy at this time.  She would like to return to work if possible with the exception of doing any heavy lifting.  Pathology from surgery: DIAGNOSIS:  A. UTERUS WITH CERVIX, BILATERAL FALLOPIAN TUBES, LEFT OVARY;  HYSTERECTOMY, BILATERAL SALPINGECTOMY AND LEFT OOPHORECTOMY:  -BENIGN ECTOCERVICAL AND ENDOCERVICAL MUCOSA (ENTIRE CERVIX EXAMINED).  -NEGATIVE FOR DYSPLASIA AND MALIGNANCY.  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  - BILATERAL BENIGN FALLOPIAN TUBES.  - BENIGN LEFT OVARY WITH HEMORRHAGIC CYST.  - BENIGN RIGHT PARATUBAL CYST.   OBJECTIVE: BP 116/75   Pulse 89   Ht 5' (1.524 m)   Wt 150 lb 9.6 oz (68.3 kg)   LMP  (LMP Unknown)   BMI 29.41 kg/m  Pleasant well-appearing female no acute distress.  Alert and oriented. Abdomen: Soft, nontender without organomegaly; laparoscopy port sites are well approximated and covered with Dermabond glue Bladder: Nontender Pelvic: Deferred  ASSESSMENT: 1.  Normal postop check 2 weeks status post LAVH bilateral salpingectomy and left oophorectomy 2.  UTI symptoms  PLAN: 1.  UA with C&S 2.  Note is given for return to work 3.  Return 03/17/2018 for final postop check.

## 2018-03-02 LAB — MICROSCOPIC EXAMINATION
BACTERIA UA: NONE SEEN
Casts: NONE SEEN /lpf

## 2018-03-02 LAB — URINALYSIS, ROUTINE W REFLEX MICROSCOPIC
BILIRUBIN UA: NEGATIVE
GLUCOSE, UA: NEGATIVE
KETONES UA: NEGATIVE
Leukocytes, UA: NEGATIVE
Nitrite, UA: NEGATIVE
PROTEIN UA: NEGATIVE
UUROB: 0.2 mg/dL (ref 0.2–1.0)
pH, UA: 5 (ref 5.0–7.5)

## 2018-03-04 ENCOUNTER — Ambulatory Visit
Admission: RE | Admit: 2018-03-04 | Discharge: 2018-03-04 | Disposition: A | Discharge status: Home / Self Care | Payer: Commercial Managed Care - PPO | Source: Ambulatory Visit | Attending: Family Medicine | Admitting: Family Medicine

## 2018-03-04 DIAGNOSIS — Z87448 Personal history of other diseases of urinary system: Secondary | ICD-10-CM | POA: Diagnosis not present

## 2018-03-04 DIAGNOSIS — R109 Unspecified abdominal pain: Secondary | ICD-10-CM | POA: Diagnosis not present

## 2018-03-04 DIAGNOSIS — N281 Cyst of kidney, acquired: Secondary | ICD-10-CM | POA: Insufficient documentation

## 2018-03-04 LAB — URINE CULTURE

## 2018-03-07 ENCOUNTER — Other Ambulatory Visit: Payer: Self-pay | Admitting: Surgical

## 2018-03-07 ENCOUNTER — Telehealth: Payer: Self-pay | Admitting: Obstetrics and Gynecology

## 2018-03-07 MED ORDER — CEPHALEXIN 500 MG PO CAPS: 500.0000 mg | ORAL_CAPSULE | Freq: Two times a day (BID) | ORAL | 0 refills | Status: DC

## 2018-03-07 NOTE — Telephone Encounter (Signed)
Patient came into office. I gave patient results and sent medication to pharmacy.

## 2018-03-07 NOTE — Telephone Encounter (Signed)
The patient states she had a culture done recently but has not gotten those results yet; however, she is having dysuria, frequency and is worried she will start having a fever and is asking if she can get a prescription called in today if possible, please advise, thanks.

## 2018-03-16 ENCOUNTER — Ambulatory Visit: Payer: Commercial Managed Care - PPO | Admitting: Obstetrics and Gynecology

## 2018-03-16 ENCOUNTER — Encounter: Payer: Self-pay | Admitting: Obstetrics and Gynecology

## 2018-03-16 VITALS — BP 140/83 | HR 108 | Wt 151.3 lb

## 2018-03-16 DIAGNOSIS — N809 Endometriosis, unspecified: Secondary | ICD-10-CM

## 2018-03-16 DIAGNOSIS — Z09 Encounter for follow-up examination after completed treatment for conditions other than malignant neoplasm: Secondary | ICD-10-CM

## 2018-03-16 DIAGNOSIS — D259 Leiomyoma of uterus, unspecified: Secondary | ICD-10-CM

## 2018-03-16 DIAGNOSIS — N870 Mild cervical dysplasia: Secondary | ICD-10-CM

## 2018-03-16 DIAGNOSIS — Z90721 Acquired absence of ovaries, unilateral: Secondary | ICD-10-CM

## 2018-03-16 DIAGNOSIS — Z90710 Acquired absence of both cervix and uterus: Secondary | ICD-10-CM

## 2018-03-16 NOTE — Patient Instructions (Addendum)
1.  Resume all activities as tolerated. 2.  Expect vaginal discharge and spotting for another 2 to 3 weeks. 3.  Return in 2 weeks to see Dr. Marcelline Mates for final postop check

## 2018-03-16 NOTE — Progress Notes (Signed)
Chief complaint: 1.  Postop check 2.  Status post LAVH bilateral salpingectomy and left oophorectomy (date of surgery 02/14/2018)  Patient presents for final postop check status post LAVH bilateral salpingectomy and left oophorectomy on 02/14/2018. Bowel and bladder function are normal. Patient is not experiencing any significant pelvic pain. Patient does report some greenish-yellow discharge and minimal spotting She has been treated for UTI postoperatively.  She also has bronchitis for which she is taking antibiotic.  Pathology: Leiomyoma; no dysplasia; left ovary with hemorrhagic cyst  OBJECTIVE: BP 140/83   Pulse (!) 108   Wt 151 lb 4.8 oz (68.6 kg)   LMP  (LMP Unknown)   BMI 29.55 kg/m  Pleasant well-appearing female in no acute distress.  She is alert and oriented.  Affect is appropriate. Back: No CVA tenderness Abdomen: Soft, nontender; no organomegaly; laparoscopy port sites are well-healed. Bladder: Nontender Pelvic: External genitalia-normal BUS-normal Vagina-good vaginal vault support; vaginal cuff is intact;; several Vicryl sutures are still intact at the apex; yellow-pink secretions present; no significant granulation tissue Cervix-surgically absent Uterus-surgically absent Adnexa-nonpalpable nontender Rectovaginal-normal external exam Extremities: Warm dry without edema  ASSESSMENT: 1.  Normal postop check 4 weeks status post LAVH bilateral salpingectomy and left oophorectomy 2.  Pathology notable for uterine fibroids and hemorrhagic left ovarian cyst; no cervical dysplasia 3.  Residual suture still intact at vaginal apex with associated vaginal discharge  PLAN: 1.  Resume all activities as tolerated; avoid intercourse for another couple weeks 2.  Return in 2 weeks for final postop check-Dr. Ellamae Sia, MD  Note: This dictation was prepared with Dragon dictation along with smaller phrase technology. Any transcriptional errors that result from  this process are unintentional.

## 2018-03-25 ENCOUNTER — Telehealth: Payer: Self-pay | Admitting: Obstetrics and Gynecology

## 2018-03-25 NOTE — Telephone Encounter (Signed)
The patient called and LVM on Christmas day at 12:29 PM asking for some Diflucan for her vaginal infection, and also some Keflex as she is experiencing some symptoms again of bladder irritation, burning, bladder spasms, and the "last time Dr. Tennis Must gave her Keflex 500 mg (I think) it worked wonderful for me!" and she would like a call back, please advise, thanks.

## 2018-03-29 MED ORDER — CEPHALEXIN 500 MG PO CAPS: 500.0000 mg | ORAL_CAPSULE | Freq: Two times a day (BID) | ORAL | 0 refills | Status: DC

## 2018-03-29 MED ORDER — FLUCONAZOLE 150 MG PO TABS: 150.0000 mg | ORAL_TABLET | Freq: Once | ORAL | 0 refills | Status: AC

## 2018-03-29 NOTE — Telephone Encounter (Signed)
Pt called no answer and was unable to leave a message due to mailbox being full. Send in medication for pt.

## 2018-03-31 NOTE — Telephone Encounter (Signed)
Pt called the office and informed that her medication had been faxed in to her pharmacy on 03/29/18. Pt was informed that she was called but no one picked up and was unable to leave a message due to voicemail being full.

## 2018-04-06 ENCOUNTER — Encounter: Payer: Self-pay | Admitting: Obstetrics and Gynecology

## 2018-04-06 ENCOUNTER — Ambulatory Visit (INDEPENDENT_AMBULATORY_CARE_PROVIDER_SITE_OTHER): Payer: Commercial Managed Care - PPO | Admitting: Obstetrics and Gynecology

## 2018-04-06 VITALS — BP 110/67 | HR 66 | Ht 62.0 in | Wt 150.2 lb

## 2018-04-06 DIAGNOSIS — Z8742 Personal history of other diseases of the female genital tract: Secondary | ICD-10-CM

## 2018-04-06 DIAGNOSIS — Z90710 Acquired absence of both cervix and uterus: Secondary | ICD-10-CM

## 2018-04-06 DIAGNOSIS — Z4889 Encounter for other specified surgical aftercare: Secondary | ICD-10-CM

## 2018-04-06 DIAGNOSIS — N3000 Acute cystitis without hematuria: Secondary | ICD-10-CM

## 2018-04-06 NOTE — Progress Notes (Signed)
Pt stated that she still having some issues with her bladder.

## 2018-04-06 NOTE — Progress Notes (Signed)
    OBSTETRICS/GYNECOLOGY POST-OPERATIVE CLINIC VISIT  Subjective:     Tracy Madden is a 48 y.o. female who presents to the clinic 7 weeks status post laparoscopic assisted vaginal hysterectomy with bilateral salpingectomy and left oophorectomy for fibroids and endometriosis, and h/o cervical dysplasia. Eating a regular diet without difficulty. Bowel movements are normal. The patient is not having any pain.  She does note that she has had 2 UTI's since her surgery.  Is currently on Keflex for the most recent UTI.   The following portions of the patient's history were reviewed and updated as appropriate: allergies, current medications, past family history, past medical history, past social history, past surgical history and problem list.  Review of Systems Pertinent items noted in HPI and remainder of comprehensive ROS otherwise negative.    Objective:    BP 110/67   Pulse 66   Ht 5\' 2"  (1.575 m)   Wt 150 lb 3.2 oz (68.1 kg)   LMP  (LMP Unknown)   BMI 27.47 kg/m  General:  alert and no distress  Abdomen: soft, bowel sounds active, non-tender  Incision:   healing well, no drainage, no erythema, no hernia, no seroma, no swelling, no dehiscence, incision well approximated  Pelvis:  Patient declines     Pathology:   A. UTERUS WITH CERVIX, BILATERAL FALLOPIAN TUBES, LEFT OVARY;  HYSTERECTOMY, BILATERAL SALPINGECTOMY AND LEFT OOPHORECTOMY:  -BENIGN ECTOCERVICAL AND ENDOCERVICAL MUCOSA (ENTIRE CERVIX EXAMINED).  -NEGATIVE FOR DYSPLASIA AND MALIGNANCY.  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  - BILATERAL BENIGN FALLOPIAN TUBES.  - BENIGN LEFT OVARY WITH HEMORRHAGIC CYST.  - BENIGN RIGHT PARATUBAL CYST.    Assessment:   - Postoperative visit status post laparoscopic assisted vaginal hysterectomy (LAVH) with bilateral salpingectomy and left oophorectomy - History of endometriosis - Acute cystitis without hematuria   Plan:   1. Continue any current medications (Keflex  for UTI). 2. Operative findings again reviewed. Pathology report discussed. 3. Activity restrictions: none 4. Anticipated return to work: has already returned to work. . 5. Inquires into genetic screening for hereditary cancers.  Notes a friend told her about the testing.  Does not have a significant history but thinks she would still like to be screened. Given info about hereditary genetic testing.  6. H/o endometriosis, currently asymptomatic after hysterectomy. Will continue to monitor symptoms.  If patient begins to become symptomatic again, can consider hormonal suppression at that time. 7. Follow up: 4 months for annual exam and f/u VIN I.    Rubie Maid, MD [facility]

## 2018-08-02 ENCOUNTER — Encounter: Payer: Commercial Managed Care - PPO | Admitting: Obstetrics and Gynecology

## 2018-09-26 ENCOUNTER — Ambulatory Visit: Payer: Commercial Managed Care - PPO | Admitting: Urology

## 2018-10-12 ENCOUNTER — Ambulatory Visit: Payer: Commercial Managed Care - PPO | Admitting: Obstetrics and Gynecology

## 2018-10-12 ENCOUNTER — Other Ambulatory Visit: Payer: Self-pay

## 2018-10-12 ENCOUNTER — Encounter: Payer: Self-pay | Admitting: Obstetrics and Gynecology

## 2018-10-12 VITALS — BP 98/72 | HR 80 | Ht 62.0 in | Wt 160.2 lb

## 2018-10-12 DIAGNOSIS — Z01419 Encounter for gynecological examination (general) (routine) without abnormal findings: Secondary | ICD-10-CM

## 2018-10-12 DIAGNOSIS — Z90710 Acquired absence of both cervix and uterus: Secondary | ICD-10-CM | POA: Diagnosis not present

## 2018-10-12 DIAGNOSIS — Z1231 Encounter for screening mammogram for malignant neoplasm of breast: Secondary | ICD-10-CM | POA: Diagnosis not present

## 2018-10-12 DIAGNOSIS — R635 Abnormal weight gain: Secondary | ICD-10-CM

## 2018-10-12 DIAGNOSIS — E785 Hyperlipidemia, unspecified: Secondary | ICD-10-CM

## 2018-10-12 DIAGNOSIS — Z131 Encounter for screening for diabetes mellitus: Secondary | ICD-10-CM

## 2018-10-12 DIAGNOSIS — N3943 Post-void dribbling: Secondary | ICD-10-CM

## 2018-10-12 DIAGNOSIS — Z90721 Acquired absence of ovaries, unilateral: Secondary | ICD-10-CM

## 2018-10-12 NOTE — Patient Instructions (Signed)
Health Maintenance, Female Adopting a healthy lifestyle and getting preventive care are important in promoting health and wellness. Ask your health care provider about:  The right schedule for you to have regular tests and exams.  Things you can do on your own to prevent diseases and keep yourself healthy. What should I know about diet, weight, and exercise? Eat a healthy diet   Eat a diet that includes plenty of vegetables, fruits, low-fat dairy products, and lean protein.  Do not eat a lot of foods that are high in solid fats, added sugars, or sodium. Maintain a healthy weight Body mass index (BMI) is used to identify weight problems. It estimates body fat based on height and weight. Your health care provider can help determine your BMI and help you achieve or maintain a healthy weight. Get regular exercise Get regular exercise. This is one of the most important things you can do for your health. Most adults should:  Exercise for at least 150 minutes each week. The exercise should increase your heart rate and make you sweat (moderate-intensity exercise).  Do strengthening exercises at least twice a week. This is in addition to the moderate-intensity exercise.  Spend less time sitting. Even light physical activity can be beneficial. Watch cholesterol and blood lipids Have your blood tested for lipids and cholesterol at 48 years of age, then have this test every 5 years. Have your cholesterol levels checked more often if:  Your lipid or cholesterol levels are high.  You are older than 48 years of age.  You are at high risk for heart disease. What should I know about cancer screening? Depending on your health history and family history, you may need to have cancer screening at various ages. This may include screening for:  Breast cancer.  Cervical cancer.  Colorectal cancer.  Skin cancer.  Lung cancer. What should I know about heart disease, diabetes, and high blood  pressure? Blood pressure and heart disease  High blood pressure causes heart disease and increases the risk of stroke. This is more likely to develop in people who have high blood pressure readings, are of African descent, or are overweight.  Have your blood pressure checked: ? Every 3-5 years if you are 18-39 years of age. ? Every year if you are 40 years old or older. Diabetes Have regular diabetes screenings. This checks your fasting blood sugar level. Have the screening done:  Once every three years after age 40 if you are at a normal weight and have a low risk for diabetes.  More often and at a younger age if you are overweight or have a high risk for diabetes. What should I know about preventing infection? Hepatitis B If you have a higher risk for hepatitis B, you should be screened for this virus. Talk with your health care provider to find out if you are at risk for hepatitis B infection. Hepatitis C Testing is recommended for:  Everyone born from 1945 through 1965.  Anyone with known risk factors for hepatitis C. Sexually transmitted infections (STIs)  Get screened for STIs, including gonorrhea and chlamydia, if: ? You are sexually active and are younger than 48 years of age. ? You are older than 48 years of age and your health care provider tells you that you are at risk for this type of infection. ? Your sexual activity has changed since you were last screened, and you are at increased risk for chlamydia or gonorrhea. Ask your health care provider if   you are at risk.  Ask your health care provider about whether you are at high risk for HIV. Your health care provider may recommend a prescription medicine to help prevent HIV infection. If you choose to take medicine to prevent HIV, you should first get tested for HIV. You should then be tested every 3 months for as long as you are taking the medicine. Pregnancy  If you are about to stop having your period (premenopausal) and  you may become pregnant, seek counseling before you get pregnant.  Take 400 to 800 micrograms (mcg) of folic acid every day if you become pregnant.  Ask for birth control (contraception) if you want to prevent pregnancy. Osteoporosis and menopause Osteoporosis is a disease in which the bones lose minerals and strength with aging. This can result in bone fractures. If you are 65 years old or older, or if you are at risk for osteoporosis and fractures, ask your health care provider if you should:  Be screened for bone loss.  Take a calcium or vitamin D supplement to lower your risk of fractures.  Be given hormone replacement therapy (HRT) to treat symptoms of menopause. Follow these instructions at home: Lifestyle  Do not use any products that contain nicotine or tobacco, such as cigarettes, e-cigarettes, and chewing tobacco. If you need help quitting, ask your health care provider.  Do not use street drugs.  Do not share needles.  Ask your health care provider for help if you need support or information about quitting drugs. Alcohol use  Do not drink alcohol if: ? Your health care provider tells you not to drink. ? You are pregnant, may be pregnant, or are planning to become pregnant.  If you drink alcohol: ? Limit how much you use to 0-1 drink a day. ? Limit intake if you are breastfeeding.  Be aware of how much alcohol is in your drink. In the [place] one drink equals one 12 oz bottle of beer (355 mL), one 5 oz glass of wine (148 mL), or one 1 oz glass of hard liquor (44 mL). General instructions  Schedule regular health, dental, and eye exams.  Stay current with your vaccines.  Tell your health care provider if: ? You often feel depressed. ? You have ever been abused or do not feel safe at home. Summary  Adopting a healthy lifestyle and getting preventive care are important in promoting health and wellness.  Follow your health care provider's instructions about healthy  diet, exercising, and getting tested or screened for diseases.  Follow your health care provider's instructions on monitoring your cholesterol and blood pressure. This information is not intended to replace advice given to you by your health care provider. Make sure you discuss any questions you have with your health care provider. Document Released: 09/29/2010 Document Revised: 03/09/2018 Document Reviewed: 03/09/2018 Elsevier Patient Education  2020 [employer]  

## 2018-10-12 NOTE — Progress Notes (Signed)
GYNECOLOGY ANNUAL PHYSICAL EXAM PROGRESS NOTE  Subjective:    Tracy Madden is a 48 y.o. G14P1001 female who presents for an annual exam. She is s/p LAVH with bilateral salpingectomy and left oophorectomy 01/2018 (for h/o endometriosis, pelvic pain, and h/o cervical dysplasia s/p LEEP). The patient is sexually active. The patient wears seatbelts: yes. The patient participates in regular exercise: yes (recently initiated). Has the patient ever been transfused or tattooed?: no. The patient reports that there is not domestic violence in her life.    The patient has the following complaints today:  1. Patient reports issues with weight gain.  Has gained 10 lbs since last visit in January.  Notes that before then she had probably gained another 5-10 lbs prior to her hysterectomy. Has been attempting to exercise recently. Does admit that she has not made much change in her diet. Is also using some OTC progesterone cream once weekly.  2. Patient reports some post-void dribbling since her hysterectomy.  Notes that she sometimes notes moisture in her underwear after voiding. Often times has to lean forward after voiding to excrete the last few drops of urine. Uses a panty liner daily.    Gynecologic History  Menarche age: 65 No LMP recorded (lmp unknown). Patient has had a hysterectomy. Contraception: status post hysterectomy History of STI's: Denies Last Pap: 08/25/2017. Results were: normal.  Reports h/o abnormal pap smears.  Has h/o LEEP procedure.  Last mammogram: 01/2018. Results were: normal   OB History  Gravida Para Term Preterm AB Living  1 1 1  0 0 1  SAB TAB Ectopic Multiple Live Births  0 0 0 0 1    # Outcome Date GA Lbr Len/2nd Weight Sex Delivery Anes PTL Lv  1 Term 23    M Vag-Spont   LIV    Past Medical History:  Diagnosis Date   Abdominal pain, LLQ    Abnormal uterine bleeding    Anemia    Cervical dysplasia    Endometriosis    Fibroid    Melanoma ([place])     RT SHOULDER   Paratubal cyst    Pneumonia    Prediabetes     Past Surgical History:  Procedure Laterality Date   CYSTOSCOPY Bilateral 02/14/2018   Procedure: CYSTOSCOPY;  Surgeon: Defrancesco, Alanda Slim, MD;  Location: [facility];  Service: Gynecology;  Laterality: Bilateral;   DILATION AND CURETTAGE OF UTERUS     HYSTEROSCOPY     LAPAROSCOPIC ASSISTED VAGINAL HYSTERECTOMY N/A 02/14/2018   Procedure: LAPAROSCOPIC ASSISTED VAGINAL HYSTERECTOMY;  Surgeon: [name], MD;  Location: [facility];  Service: Gynecology;  Laterality: N/A;   LAPAROSCOPIC BILATERAL SALPINGECTOMY Bilateral 02/14/2018   Procedure: LAPAROSCOPIC BILATERAL SALPINGECTOMY;  Surgeon: [name], MD;  Location: [facility];  Service: Gynecology;  Laterality: Bilateral;   LAPAROSCOPY     DRAINAGE AND EXCISION OF PARATUBAL CYST   LEEP  2002   LEEP N/A 03/16/2016   Procedure: LOOP ELECTROSURGICAL EXCISION PROCEDURE (LEEP);  Surgeon: [name], MD;  Location: [facility];  Service: Gynecology;  Laterality: N/A;   NOVASURE ABLATION  2016   TONSILLECTOMY      Family History  Problem Relation Age of Onset   Uterine cancer Mother    Cancer Neg Hx     Social History   Socioeconomic History   Marital status: Married    Spouse name: Not on file   Number of children: Not on file   Years of education: Not  on file   Highest education level: Not on file  Occupational History   Not on file  Social Needs   Financial resource strain: Not on file   Food insecurity    Worry: Not on file    Inability: Not on file   Transportation needs    Medical: Not on file    Non-medical: Not on file  Tobacco Use   Smoking status: Never Smoker   Smokeless tobacco: Never Used  Substance and Sexual Activity   Alcohol use: Yes    Comment: OCCAS   Drug use: No   Sexual activity: Not Currently    Birth control/protection: Surgical  Lifestyle   Physical activity    Days per week:  Not on file    Minutes per session: Not on file   Stress: Not on file  Relationships   Social connections    Talks on phone: Not on file    Gets together: Not on file    Attends religious service: Not on file    Active member of club or organization: Not on file    Attends meetings of clubs or organizations: Not on file    Relationship status: Not on file   Intimate partner violence    Fear of current or ex partner: Not on file    Emotionally abused: Not on file    Physically abused: Not on file    Forced sexual activity: Not on file  Other Topics Concern   Not on file  Social History Narrative   Not on file    Current Outpatient Medications on File Prior to Visit  Medication Sig Dispense Refill   OVER THE COUNTER MEDICATION Take 1 tablet by mouth daily. Kidney Detox Supplement     OVER THE COUNTER MEDICATION Take 1 tablet by mouth daily. Liver Detox Supplement     cephALEXin (KEFLEX) 500 MG capsule Take 1 capsule (500 mg total) by mouth 2 (two) times daily. (Patient not taking: Reported on 10/12/2018) 14 capsule 0   No current facility-administered medications on file prior to visit.     Allergies  Allergen Reactions   Aspirin Shortness Of Breath and Other (See Comments)    Inhale aspirin, no reaction to oral aspirin    Eggs Or Egg-Derived Products Anaphylaxis   Niacin Hives, Rash and Other (See Comments)    [place] in a matter of seconds      Review of Systems Constitutional: negative for chills, fatigue, fevers and sweats. Positive for weight gain (see HPI). Eyes: negative for irritation, redness and visual disturbance Ears, nose, mouth, throat, and face: negative for hearing loss, nasal congestion, snoring and tinnitus Respiratory: negative for asthma, cough, sputum Cardiovascular: negative for chest pain, dyspnea, exertional chest pressure/discomfort, irregular heart beat, palpitations and syncope Gastrointestinal: negative for abdominal pain, change in  bowel habits, nausea and vomiting Genitourinary: negative for abnormal menstrual periods, genital lesions, sexual problems and vaginal discharge, dysuria and urinary incontinence.  Positive for post-void dribbling.  Integument/breast: negative for breast lump, breast tenderness and nipple discharge Hematologic/lymphatic: negative for bleeding and easy bruising Musculoskeletal:negative for back pain and muscle weakness Neurological: negative for dizziness, headaches, vertigo and weakness Endocrine: negative for diabetic symptoms including polydipsia, polyuria and skin dryness Allergic/Immunologic: negative for hay fever and urticaria        Objective:  Blood pressure 98/72, pulse 80, height 5\' 2"  (1.575 m), weight 160 lb 3.2 oz (72.7 kg). Body mass index is 29.3 kg/m.  General Appearance:  Alert, cooperative, no distress, appears stated age, overweight  Head:    Normocephalic, without obvious abnormality, atraumatic  Eyes:    PERRL, conjunctiva/corneas clear, EOM's intact, both eyes  Ears:    Normal external ear canals, both ears  Nose:   Nares normal, septum midline, mucosa normal, no drainage or sinus tenderness  Throat:   Lips, mucosa, and tongue normal; teeth and gums normal  Neck:   Supple, symmetrical, trachea midline, no adenopathy; thyroid: no enlargement/tenderness/nodules; no carotid bruit or JVD  Back:     Symmetric, no curvature, ROM normal, no CVA tenderness  Lungs:     Clear to auscultation bilaterally, respirations unlabored  Chest Wall:    No tenderness or deformity   Heart:    Regular rate and rhythm, S1 and S2 normal, no murmur, rub or gallop  Breast Exam:    No tenderness, masses, or nipple abnormality  Abdomen:     Soft, non-tender, bowel sounds active all four quadrants, no masses, no organomegaly.    Genitalia:    Pelvic:external genitalia normal, vagina without lesions, discharge, or tenderness, rectovaginal septum  Normal. Uterus and cervix surgically absent,  vaginal cuff well healed.  Left adnexa surgically absent.  No right adnexal masses or tenderness.    Rectal:    Normal external sphincter.  No hemorrhoids appreciated. Internal exam not done.   Extremities:   Extremities normal, atraumatic, no cyanosis or edema  Pulses:   2+ and symmetric all extremities  Skin:   Skin color, texture, turgor normal, no rashes or lesions  Lymph nodes:   Cervical, supraclavicular, and axillary nodes normal  Neurologic:   CNII-XII intact, normal strength, sensation and reflexes throughout   .  Labs:  Lab Results  Component Value Date   WBC 15.1 (H) 02/14/2018   HGB 13.0 02/14/2018   HCT 40.7 02/14/2018   MCV 90.4 02/14/2018   PLT 289 02/14/2018    No results found for: CREATININE, BUN, NA, K, CL, CO2  No results found for: ALT, AST, GGT, ALKPHOS, BILITOT  No results found for: TSH   Patient has had some labs drawn by PCP in November, in [place].   Assessment:   Encounter for well woman exam with routine gynecological exam  Breast cancer screening by mammogram  Screening for diabetes mellitus  Status post LAVH bilateral salpingectomy left oophorectomy  Weight gain  Post-void dribbling. Dyslipidemia   Plan:     Blood tests: CBC with diff, Comprehensive metabolic panel, Lipoproteins, TSH and A1c. Breast self exam technique reviewed and patient encouraged to perform self-exam monthly. Contraception: status post hysterectomy. Discussed healthy lifestyle modifications. Also discussed OTC remedies for for aiding weight loss.  Mammogram up to date. Next due in November. Dyslipidemia noted on last labs, will repeat today.  Postvoid dribbling, discussed self-management.  Pap smear no longer needed. Patient is s/p hysterectomy.   Follow up in 1 year for annual exam.     Rubie Maid, MD [facility]

## 2018-10-12 NOTE — Progress Notes (Signed)
Pt is present today for annual exam. Pt stated that she is doing well no problems.  

## 2018-10-13 LAB — COMPREHENSIVE METABOLIC PANEL
ALT: 14 IU/L (ref 0–32)
AST: 20 IU/L (ref 0–40)
Albumin/Globulin Ratio: 2.3 — ABNORMAL HIGH (ref 1.2–2.2)
Albumin: 4.5 g/dL (ref 3.8–4.8)
Alkaline Phosphatase: 72 IU/L (ref 39–117)
BUN/Creatinine Ratio: 16 (ref 9–23)
BUN: 13 mg/dL (ref 6–24)
Bilirubin Total: <0.2 mg/dL (ref 0.0–1.2)
CO2: 23 mmol/L (ref 20–29)
Calcium: 9.8 mg/dL (ref 8.7–10.2)
Chloride: 102 mmol/L (ref 96–106)
Creatinine, Ser: 0.82 mg/dL (ref 0.57–1.00)
GFR calc Af Amer: 98 mL/min/{1.73_m2} (ref >59)
GFR calc non Af Amer: 85 mL/min/{1.73_m2} (ref >59)
Globulin, Total: 2 g/dL (ref 1.5–4.5)
Glucose: 91 mg/dL (ref 65–99)
Potassium: 4.2 mmol/L (ref 3.5–5.2)
Sodium: 143 mmol/L (ref 134–144)
Total Protein: 6.5 g/dL (ref 6.0–8.5)

## 2018-10-13 LAB — CBC
Hematocrit: 39.6 % (ref 34.0–46.6)
Hemoglobin: 13.4 g/dL (ref 11.1–15.9)
MCH: 28.9 pg (ref 26.6–33.0)
MCHC: 33.8 g/dL (ref 31.5–35.7)
MCV: 85 fL (ref 79–97)
Platelets: 315 10*3/uL (ref 150–450)
RBC: 4.64 x10E6/uL (ref 3.77–5.28)
RDW: 12.7 % (ref 11.7–15.4)
WBC: 6.3 10*3/uL (ref 3.4–10.8)

## 2018-10-13 LAB — HEMOGLOBIN A1C
Est. average glucose Bld gHb Est-mCnc: 120 mg/dL
Hgb A1c MFr Bld: 5.8 % — ABNORMAL HIGH (ref 4.8–5.6)

## 2018-10-13 LAB — LIPID PANEL
Chol/HDL Ratio: 4.4 ratio (ref 0.0–4.4)
Cholesterol, Total: 182 mg/dL (ref 100–199)
HDL: 41 mg/dL (ref >39)
LDL Calculated: 95 mg/dL (ref 0–99)
Triglycerides: 232 mg/dL — ABNORMAL HIGH (ref 0–149)
VLDL Cholesterol Cal: 46 mg/dL — ABNORMAL HIGH (ref 5–40)

## 2018-11-14 ENCOUNTER — Ambulatory Visit: Payer: Commercial Managed Care - PPO | Admitting: Urology

## 2018-12-19 ENCOUNTER — Ambulatory Visit: Payer: Commercial Managed Care - PPO | Admitting: Urology

## 2018-12-30 ENCOUNTER — Other Ambulatory Visit: Payer: Self-pay | Admitting: Family Medicine

## 2018-12-30 ENCOUNTER — Other Ambulatory Visit (HOSPITAL_COMMUNITY): Payer: Self-pay | Admitting: Family Medicine

## 2018-12-30 DIAGNOSIS — N281 Cyst of kidney, acquired: Secondary | ICD-10-CM

## 2018-12-30 DIAGNOSIS — R109 Unspecified abdominal pain: Secondary | ICD-10-CM

## 2019-01-23 ENCOUNTER — Ambulatory Visit (INDEPENDENT_AMBULATORY_CARE_PROVIDER_SITE_OTHER): Payer: Commercial Managed Care - PPO | Admitting: Urology

## 2019-01-23 DIAGNOSIS — N3946 Mixed incontinence: Secondary | ICD-10-CM

## 2019-01-24 NOTE — Progress Notes (Signed)
Entered in error

## 2019-01-30 ENCOUNTER — Other Ambulatory Visit: Payer: Self-pay | Admitting: Family Medicine

## 2019-01-30 DIAGNOSIS — Z1231 Encounter for screening mammogram for malignant neoplasm of breast: Secondary | ICD-10-CM

## 2019-02-13 ENCOUNTER — Ambulatory Visit
Admission: RE | Admit: 2019-02-13 | Discharge: 2019-02-13 | Disposition: A | Discharge status: Home / Self Care | Payer: Commercial Managed Care - PPO | Source: Ambulatory Visit | Attending: Family Medicine | Admitting: Family Medicine

## 2019-02-13 ENCOUNTER — Other Ambulatory Visit: Payer: Self-pay

## 2019-02-13 DIAGNOSIS — R109 Unspecified abdominal pain: Secondary | ICD-10-CM | POA: Diagnosis not present

## 2019-02-13 DIAGNOSIS — N281 Cyst of kidney, acquired: Secondary | ICD-10-CM

## 2019-03-03 ENCOUNTER — Ambulatory Visit
Admission: RE | Admit: 2019-03-03 | Discharge: 2019-03-03 | Disposition: A | Discharge status: Home / Self Care | Payer: Commercial Managed Care - PPO | Source: Ambulatory Visit | Attending: Family Medicine | Admitting: Family Medicine

## 2019-03-03 DIAGNOSIS — Z1231 Encounter for screening mammogram for malignant neoplasm of breast: Secondary | ICD-10-CM | POA: Insufficient documentation

## 2019-03-07 ENCOUNTER — Other Ambulatory Visit: Payer: Self-pay | Admitting: Family Medicine

## 2019-03-07 DIAGNOSIS — R928 Other abnormal and inconclusive findings on diagnostic imaging of breast: Secondary | ICD-10-CM

## 2019-03-16 ENCOUNTER — Ambulatory Visit
Admission: RE | Admit: 2019-03-16 | Discharge: 2019-03-16 | Disposition: A | Discharge status: Home / Self Care | Payer: Commercial Managed Care - PPO | Source: Ambulatory Visit | Attending: Family Medicine | Admitting: Family Medicine

## 2019-03-16 DIAGNOSIS — R928 Other abnormal and inconclusive findings on diagnostic imaging of breast: Secondary | ICD-10-CM | POA: Diagnosis present

## 2019-03-20 ENCOUNTER — Other Ambulatory Visit: Payer: Self-pay | Admitting: Family Medicine

## 2019-03-20 DIAGNOSIS — R928 Other abnormal and inconclusive findings on diagnostic imaging of breast: Secondary | ICD-10-CM

## 2019-03-22 ENCOUNTER — Other Ambulatory Visit: Payer: Self-pay

## 2019-03-22 ENCOUNTER — Ambulatory Visit
Admission: RE | Admit: 2019-03-22 | Discharge: 2019-03-22 | Disposition: A | Discharge status: Home / Self Care | Payer: Commercial Managed Care - PPO | Source: Ambulatory Visit | Attending: Family Medicine | Admitting: Family Medicine

## 2019-03-22 DIAGNOSIS — R928 Other abnormal and inconclusive findings on diagnostic imaging of breast: Secondary | ICD-10-CM | POA: Insufficient documentation

## 2019-03-22 HISTORY — PX: BREAST BIOPSY: SHX20

## 2019-03-23 LAB — SURGICAL PATHOLOGY

## 2019-04-06 ENCOUNTER — Other Ambulatory Visit: Payer: Self-pay | Admitting: General Surgery

## 2019-04-06 ENCOUNTER — Ambulatory Visit: Payer: Self-pay | Admitting: General Surgery

## 2019-04-06 DIAGNOSIS — N6489 Other specified disorders of breast: Secondary | ICD-10-CM

## 2019-04-06 NOTE — H&P (Signed)
PATIENT PROFILE: Tracy Madden is a 49 y.o. female who presents to the Clinic for consultation at the request of Dr. Kary Kos for evaluation of right breast complex sclerosing lesion.  PCP:  Lovie Macadamia, MD  HISTORY OF PRESENT ILLNESS: Tracy Madden reports she had her usual screening mammogram on March 03, 2019.  A suspicious distortion was identified that led to diagnostic mammogram and ultrasound.  This was confirmed and core needle biopsy was done.  Needle biopsy shows complete sclerosing lesion without atypia.  Patient denies previous history of palpable mass, breast pain, nipple retraction, nipple discharge.  Patient and his personal history of melanoma (in situ) on the right shoulder  Family history of breast cancer: None Family history of other cancers: Aunt with intestinal cancer Menarche: 57 years old Menopause: 49 years old (hysterectomy) Used OCP: None Used estrogen and progesterone therapy: Progesterone cream History of Radiation to the chest: None Number of pregnancies: 4 pregnancies  PROBLEM LIST:         Problem List  Date Reviewed: 12/29/2018         Noted   Iron deficiency anemia, unspecified iron deficiency 04/05/2014   Cervical dysplasia Unknown   Endometriosis Unknown      GENERAL REVIEW OF SYSTEMS:   General ROS: negative for - chills, fatigue, fever, weight gain or weight loss Allergy and Immunology ROS: negative for - hives  Hematological and Lymphatic ROS: negative for - bleeding problems or bruising, negative for palpable nodes Endocrine ROS: negative for - heat or cold intolerance, hair changes Respiratory ROS: negative for - cough, shortness of breath or wheezing Cardiovascular ROS: no chest pain or palpitations GI ROS: negative for nausea, vomiting, abdominal pain, diarrhea, constipation Musculoskeletal ROS: negative for - joint swelling or muscle pain Neurological ROS: negative for - confusion, syncope Dermatological ROS:  negative for pruritus and rash Psychiatric: negative for anxiety, depression, difficulty sleeping and memory loss  MEDICATIONS: Current Medications        Current Outpatient Medications  Medication Sig Dispense Refill  . COLOSTRUM, BOVINE ORAL Take 1 capsule by mouth continuously as needed.    . ergocalciferol, vitamin D2, 1,250 mcg (50,000 unit) capsule TAKE 1 CAPSULE BY MOUTH WEEKLY 12 capsule 0   No current facility-administered medications for this visit.       ALLERGIES: Aspirin, Egg, and Niacin  PAST MEDICAL HISTORY:     Past Medical History:  Diagnosis Date  . Acute thyroiditis 08/2013  . Cervical dysplasia   . Endometriosis   . External hemorrhoids 04/10/2014  . Internal hemorrhoids 04/10/2014  . Paratubal cyst     PAST SURGICAL HISTORY:      Past Surgical History:  Procedure Laterality Date  . COLONOSCOPY  04/10/2014   FHx of colon polyps-Father/Normal Colon/Repeat 61yr/MGR (anw)  . DRAINAGE AND EXCISION OF PARATUBAL CYST  05/2002   6 cm left paratubal cys was removed and endometriosis was fulgurated  . EGD  04/10/2014   Normal Biopsies/No Repeat/MGR  . LEEP CONE BIOPSY  02/2001   no evidence of dysplasia on pathology  . TONSILLECTOMY       FAMILY HISTORY:      Family History  Problem Relation Age of Onset  . Prostate cancer Father   . Colon polyps Father   . Bipolar disorder Son   . Thyroid disease Mother   . Colon cancer Neg Hx      SOCIAL HISTORY: Social History          Socioeconomic History  .  Marital status: Married    Spouse name: Not on file  . Number of children: Not on file  . Years of education: Not on file  . Highest education level: Not on file  Occupational History  . Not on file  Social Needs  . Financial resource strain: Not on file  . Food insecurity    Worry: Not on file    Inability: Not on file  . Transportation needs    Medical: Not on file    Non-medical: Not on file  Tobacco  Use  . Smoking status: Never Smoker  . Smokeless tobacco: Never Used  Substance and Sexual Activity  . Alcohol use: No    Alcohol/week: 0.0 standard drinks  . Drug use: No  . Sexual activity: Defer  Other Topics Concern  . Not on file  Social History Narrative  . Not on file      PHYSICAL EXAM:    Vitals:   04/06/19 1013  BP: (!) 130/90  Pulse: 77   Body mass index is 27.12 kg/m. Weight: 71.7 kg (158 lb)   GENERAL: Alert, active, oriented x3  HEENT: Pupils equal reactive to light. Extraocular movements are intact. Sclera clear. Palpebral conjunctiva normal red color.Pharynx clear.  NECK: Supple with no palpable mass and no adenopathy.  LUNGS: Sound clear with no rales rhonchi or wheezes.  HEART: Regular rhythm S1 and S2 without murmur.  BREAST: breasts appear normal, no suspicious masses, no skin or nipple changes or axillary nodes.  ABDOMEN: Soft and depressible, nontender with no palpable mass, no hepatomegaly.  EXTREMITIES: Well-developed well-nourished symmetrical with no dependent edema.  NEUROLOGICAL: Awake alert oriented, facial expression symmetrical, moving all extremities.  REVIEW OF DATA: I have reviewed the following data today:      Appointment on 02/20/2019  Component Date Value  . Glucose 02/20/2019 95   . Sodium 02/20/2019 141   . Potassium 02/20/2019 4.4   . Chloride 02/20/2019 106   . Carbon Dioxide (CO2) 02/20/2019 27.9   . Urea Nitrogen (BUN) 02/20/2019 12   . Creatinine 02/20/2019 0.6   . Glomerular Filtration Ra* 02/20/2019 107   . Calcium 02/20/2019 9.4   . AST  02/20/2019 21   . ALT  02/20/2019 19   . Alk Phos (alkaline Phosp* 02/20/2019 89   . Albumin 02/20/2019 4.4   . Bilirubin, Total 02/20/2019 0.5   . Protein, Total 02/20/2019 6.7   . A/G Ratio 02/20/2019 1.9   . Cholesterol, Total 02/20/2019 195   . Triglyceride 02/20/2019 169   . HDL (High Density Lipopr* 02/20/2019 46.5   . LDL Calculated 02/20/2019 115    . VLDL Cholesterol 02/20/2019 34   . Cholesterol/HDL Ratio 02/20/2019 4.2   . Vitamin D, 25-Hydroxy - * 02/20/2019 56.2   . Thyroid Stimulating Horm* 02/20/2019 2.032   . WBC (White Blood Cell Co* 02/20/2019 5.8   . RBC (Red Blood Cell Coun* 02/20/2019 4.68   . Hemoglobin 02/20/2019 13.6   . Hematocrit 02/20/2019 40.5   . MCV (Mean Corpuscular Vo* 02/20/2019 86.5   . MCH (Mean Corpuscular He* 02/20/2019 29.1   . MCHC (Mean Corpuscular H* 02/20/2019 33.6   . Platelet Count 02/20/2019 304   . RDW-CV (Red Cell Distrib* 02/20/2019 12.5   . MPV (Mean Platelet Volum* 02/20/2019 9.8   . Neutrophils 02/20/2019 3.70   . Lymphocytes 02/20/2019 1.70   . Mixed Count 02/20/2019 0.40   . Neutrophil % 02/20/2019 64.4   . Lymphocyte % 02/20/2019 29.1   .  Mixed % 02/20/2019 6.5   . Iron 02/20/2019 145      ASSESSMENT: Ms. Mcclees is a 49 y.o. female presenting for consultation for right breast complex recognition.    Patient was oriented again about the pathology results.  Patient oriented about the implication of having a complex early lesion.  Patient oriented about low percentage of patient lesions upgraded to malignancies. Surgical technique and post operative care was discussed with patient. Risk of surgery was discussed with patient including but not limited to: wound infection, seroma, hematoma, brachial plexopathy, mondor's disease (thrombosis of small veins of breast), chronic wound pain, breast lymphedema, altered sensation to the nipple and cosmesis among others.   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  Right breast needle guided excisional biopsy (19125) 2. CBC, CMP done 02/20/2019 3. Avoid taking aspirin 5 days before surgery 4. Contact us if has any question or concern.   Patient verbalized understanding, all questions were answered, and were agreeable with the plan outlined above.     Herbert Pun, MD  Electronically signed by Herbert Pun, MD

## 2019-04-12 ENCOUNTER — Inpatient Hospital Stay: Admission: RE | Admit: 2019-04-12 | Payer: Commercial Managed Care - PPO | Source: Ambulatory Visit

## 2019-04-14 ENCOUNTER — Encounter: Payer: Self-pay | Admitting: Obstetrics and Gynecology

## 2019-04-14 ENCOUNTER — Other Ambulatory Visit: Payer: Self-pay

## 2019-04-14 ENCOUNTER — Ambulatory Visit (INDEPENDENT_AMBULATORY_CARE_PROVIDER_SITE_OTHER): Payer: Commercial Managed Care - PPO | Admitting: Obstetrics and Gynecology

## 2019-04-14 VITALS — BP 109/68 | HR 75 | Ht 62.0 in | Wt 164.0 lb

## 2019-04-14 DIAGNOSIS — E669 Obesity, unspecified: Secondary | ICD-10-CM | POA: Diagnosis not present

## 2019-04-14 DIAGNOSIS — G4700 Insomnia, unspecified: Secondary | ICD-10-CM | POA: Diagnosis not present

## 2019-04-14 DIAGNOSIS — Z90710 Acquired absence of both cervix and uterus: Secondary | ICD-10-CM

## 2019-04-14 DIAGNOSIS — R635 Abnormal weight gain: Secondary | ICD-10-CM

## 2019-04-14 NOTE — Progress Notes (Signed)
    GYNECOLOGY PROGRESS NOTE  Subjective:    Patient ID: Tracy Madden, female    DOB: 05-May-1970, 49 y.o.   MRN: [id number]  HPI  Patient is a 49 y.o. G36P1001 female who presents for "check up" after hysterectomy.  She notes that overall she is doing well, but is concerned about the weight gain she has picked up over the past year.  She also desires to have her labs rechecked (cholesterol, diabetes screen).  Last annual exam was 09/2018. She reports hot flushes are minimal. She does note some issues with insomnia (difficulty staying asleep, usually goes to bed around 10 or 11 pm, wakes up around 3 or 4 a.m.)  The following portions of the patient's history were reviewed and updated as appropriate: allergies, current medications, past family history, past medical history, past social history, past surgical history and problem list.  Review of Systems Pertinent items noted in HPI and remainder of comprehensive ROS otherwise negative.   Objective:   Blood pressure 109/68, pulse 75, height 5\' 2"  (1.575 m), weight 164 lb (74.4 kg). General appearance: alert and no distress No exam performed today.    Assessment:   1. S/P hysterectomy   2. Weight gain   3. Obesity (BMI 30.0-34.9)   4. Insomnia, unspecified type     Plan:   1. S/p hysterectomy. Overall doing well, no major issues. Has occasional bouts of vaginitis which she self-treats with boric acid suppositories.  2. Weight gain - review of chart over past year notes weight gain of 14 lbs. Discussed dietary habits which patient notes she could work to improve. Also encouraged increasing physical activity. Offered patient referral to medical weight management, patient happy to do. Will place referral.  3. Insomnia - may be secondary to age as patient nearing close to menopausal age (50) although may be in menopause at this time (unclear as patient has had hysterectomy).  Has tried melatonin with no improvement. Patient notes that  overall this is not bothersome to her, declines treatment at this time.   RTC in 6-7 months for annual exam.    Rubie Maid, MD [facility]

## 2019-04-14 NOTE — Progress Notes (Signed)
Pt is present today for a follow up after hysterectomy surgery. Pt stated that she is doing well no problems.

## 2019-04-17 ENCOUNTER — Other Ambulatory Visit: Admission: RE | Admit: 2019-04-17 | Payer: Commercial Managed Care - PPO | Source: Ambulatory Visit

## 2019-04-19 ENCOUNTER — Ambulatory Visit: Admit: 2019-04-19 | Payer: Commercial Managed Care - PPO | Admitting: General Surgery

## 2019-04-19 ENCOUNTER — Ambulatory Visit: Payer: Commercial Managed Care - PPO

## 2019-04-19 SURGERY — EXCISION OF BREAST BIOPSY
Anesthesia: General | Site: Breast | Laterality: Right

## 2019-05-02 ENCOUNTER — Ambulatory Visit: Payer: Self-pay | Admitting: Surgery

## 2019-05-02 ENCOUNTER — Other Ambulatory Visit: Payer: Self-pay

## 2019-05-02 ENCOUNTER — Ambulatory Visit (INDEPENDENT_AMBULATORY_CARE_PROVIDER_SITE_OTHER): Payer: Commercial Managed Care - PPO | Admitting: Surgery

## 2019-05-02 ENCOUNTER — Encounter: Payer: Self-pay | Admitting: Surgery

## 2019-05-02 VITALS — BP 129/82 | HR 75 | Temp 95.9°F | Resp 12 | Ht 63.0 in | Wt 167.4 lb

## 2019-05-02 DIAGNOSIS — R928 Other abnormal and inconclusive findings on diagnostic imaging of breast: Secondary | ICD-10-CM

## 2019-05-02 DIAGNOSIS — N6021 Fibroadenosis of right breast: Secondary | ICD-10-CM | POA: Diagnosis not present

## 2019-05-02 NOTE — H&P (View-Only) (Signed)
Patient ID: [name], female   DOB: 12/16/1970, 49 y.o.   MRN: [id number]  Chief Complaint: Complex sclerosing lesion right breast  History of Present Illness Tracy Madden is a 49 y.o. female with no significant family history, or prior breast history who underwent screening mammography, area of architectural distortion noted on the right breast which was biopsied with benign diagnosis of complex sclerosing lesion, no atypia and no malignancy.  She had seen another surgeon, and came here seeking a second opinion.  She reports her only family history of revolves around a uterine fibrosis, denying any known history of breast cancer in any of her female family members.  No history of ovarian cancer as well.  She denies any prior hormonal use.  She has no known palpable breast lump or dermal changes.  She denied any history of breast pain.  Past Medical History Past Medical History:  Diagnosis Date  . Abdominal pain, LLQ   . Abnormal uterine bleeding   . Anemia   . Cervical dysplasia   . Endometriosis   . Fibroid   . Melanoma ([place])    RT SHOULDER  . Paratubal cyst   . Pneumonia   . Prediabetes       Past Surgical History:  Procedure Laterality Date  . BREAST BIOPSY Right 03/22/2019   Affirm Bx-"X" clip-path pending  . CYSTOSCOPY Bilateral 02/14/2018   Procedure: CYSTOSCOPY;  Surgeon: Defrancesco, Alanda Slim, MD;  Location: [facility] ORS;  Service: Gynecology;  Laterality: Bilateral;  . DILATION AND CURETTAGE OF UTERUS    . HYSTEROSCOPY    . LAPAROSCOPIC ASSISTED VAGINAL HYSTERECTOMY N/A 02/14/2018   Procedure: LAPAROSCOPIC ASSISTED VAGINAL HYSTERECTOMY;  Surgeon: [name], MD;  Location: [facility] OR[facility];  Service: Gynecology;  Laterality: N/A;  . LAPAROSCOPIC BILATERAL SALPINGECTOMY Bilateral 02/14/2018   Procedure: LAPAROSCOPIC BILATERAL SALPINGECTOMY;  Surgeon: [name], MD;  Location: [facility] ORS;  Service: Gynecology;  Laterality: Bilateral;  . LAPAROSCOPY      DRAINAGE AND EXCISION OF PARATUBAL CYST  . LEEP  2002  . LEEP N/A 03/16/2016   Procedure: LOOP ELECTROSURGICAL EXCISION PROCEDURE (LEEP);  Surgeon: [name], MD;  Location: [facility] OR[facility];  Service: Gynecology;  Laterality: N/A;  . NOVASURE ABLATION  2016  . TONSILLECTOMY      Allergies  Allergen Reactions  . Aspirin Shortness Of Breath and Other (See Comments)    Inhale aspirin, no reaction to oral aspirin   . Eggs Or Egg-Derived Products Anaphylaxis  . Niacin Hives, Rash and Other (See Comments)    [place] in a matter of seconds    Current Outpatient Medications  Medication Sig Dispense Refill  . Ascorbic Acid (VITAMIN C PO) Take 1 tablet by mouth daily.    . Cholecalciferol (VITAMIN D3 PO) Take 1 tablet by mouth 3 (three) times a week.    . Multiple Vitamins-Minerals (AIRBORNE GUMMIES PO) Take 2 tablets by mouth daily.    . Multiple Vitamins-Minerals (ZINC PO) Take 1 tablet by mouth daily.     No current facility-administered medications for this visit.    Family History Family History  Problem Relation Age of Onset  . Uterine cancer Mother       Social History Social History   Tobacco Use  . Smoking status: Never Smoker  . Smokeless tobacco: Never Used  Substance Use Topics  . Alcohol use: Yes    Comment: OCCAS  . Drug use: No        Review of Systems  Constitutional: Negative.   HENT: Negative.   Eyes: Negative.   Respiratory: Negative.   Cardiovascular: Negative.   Gastrointestinal: Negative.   Genitourinary: Negative.   Musculoskeletal: Negative.   Skin: Negative.   Neurological: Negative.   Endo/Heme/Allergies: Negative.       Physical Exam Blood pressure 129/82, pulse 75, temperature (!) 95.9 F (35.5 C), temperature source Temporal, resp. rate 12, height 5\' 3"  (1.6 m), weight 167 lb 6.4 oz (75.9 kg), SpO2 94 %. Last Weight  Most recent update: 05/02/2019  2:38 PM   Weight  75.9 kg (167 lb 6.4 oz)            CONSTITUTIONAL: Well  developed, and nourished, appropriately responsive and aware without distress.   EYES: Sclera non-icteric.   EARS, NOSE, MOUTH AND THROAT: Mask worn.    Hearing is intact to voice.  NECK: Trachea is midline, and there is no jugular venous distension.  LYMPH NODES:  Lymph nodes in the neck are not enlarged. RESPIRATORY:  Lungs are clear, and breath sounds are equal bilaterally. Normal respiratory effort without pathologic use of accessory muscles. CARDIOVASCULAR: Heart is regular in rate and rhythm. GI: The abdomen is soft, nontender, and nondistended. There were no palpable masses. I did not appreciate hepatosplenomegaly. There were normal bowel sounds. MUSCULOSKELETAL:  Symmetrical muscle tone appreciated in all four extremities.    SKIN: Skin turgor is normal. No pathologic skin lesions appreciated.  NEUROLOGIC:  Motor and sensation appear grossly normal.  Cranial nerves are grossly without defect. PSYCH:  Alert and oriented to person, place and time. Affect is appropriate for situation.  Data Reviewed I have personally reviewed what is currently available of the patient's imaging, recent labs and medical records.   Labs:  CBC Latest Ref Rng & Units 10/12/2018 02/14/2018 03/12/2016  WBC 3.4 - 10.8 x10E3/uL 6.3 15.1(H) 6.0  Hemoglobin 11.1 - 15.9 g/dL 13.4 13.0 13.7  Hematocrit 34.0 - 46.6 % 39.6 40.7 40.4  Platelets 150 - 450 x10E3/uL 315 289 287   CMP Latest Ref Rng & Units 10/12/2018  Glucose 65 - 99 mg/dL 91  BUN 6 - 24 mg/dL 13  Creatinine 0.57 - 1.00 mg/dL 0.82  Sodium 134 - 144 mmol/L 143  Potassium 3.5 - 5.2 mmol/L 4.2  Chloride 96 - 106 mmol/L 102  CO2 20 - 29 mmol/L 23  Calcium 8.7 - 10.2 mg/dL 9.8  Total Protein 6.0 - 8.5 g/dL 6.5  Total Bilirubin 0.0 - 1.2 mg/dL <0.2  Alkaline Phos 39 - 117 IU/L 72  AST 0 - 40 IU/L 20  ALT 0 - 32 IU/L 14      Imaging: Radiology review:  EXAM: DIGITAL SCREENING BILATERAL MAMMOGRAM WITH TOMO AND CAD  COMPARISON:  Previous  exam(s).  ACR Breast Density Category d: The breast tissue is extremely dense, which lowers the sensitivity of mammography.  FINDINGS: In the right breast, possible distortion warrants further evaluation. In the left breast, no findings suspicious for malignancy. Images were processed with CAD.  IMPRESSION: Further evaluation is suggested for possible distortion in the right breast.  RECOMMENDATION: Diagnostic mammogram and possibly ultrasound of the right breast. (Code:[id number])  The patient will be contacted regarding the findings, and additional imaging will be scheduled.  BI-RADS CATEGORY  0: Incomplete. Need additional imaging evaluation and/or prior mammograms for comparison.   Electronically Signed   By: Ammie Ferrier M.D.   On: 03/03/2019 13:51  CLINICAL DATA:  Patient recalled from screening for right breast distortion.  EXAM:  DIGITAL DIAGNOSTIC RIGHT MAMMOGRAM WITH CAD AND TOMO  ULTRASOUND RIGHT BREAST  COMPARISON:  Previous exam(s).  ACR Breast Density Category c: The breast tissue is heterogeneously dense, which may obscure small masses.  FINDINGS: Persistent focal distortion demonstrated within the lateral aspect of the right breast middle depth. This distortion is predominately demonstrated on the full paddle true lateral and spot compression MLO tomosynthesis images. The distortion is not as well demonstrated on the exaggerated CC lateral images.  Mammographic images were processed with CAD.  On physical exam, no discrete mass is palpated within the outer right breast.  Targeted ultrasound is performed, showing normal tissue without suspicious mass within the outer right breast.  No right axillary adenopathy.  IMPRESSION: Persistent focal distortion within the outer right breast. Distortion is demonstrated best on the MLO and true lateral views.  RECOMMENDATION: Stereotactic guided core needle biopsy persistent  focal right breast distortion outer right breast.  I have discussed the findings and recommendations with the patient. If applicable, a reminder letter will be sent to the patient regarding the next appointment.  BI-RADS CATEGORY  4: Suspicious.   Electronically Signed   By: Lovey Newcomer M.D.   On: 03/16/2019 16:08  CLINICAL DATA:  Status post stereotactic core needle biopsy an area of architectural distortion in the lateral right breast. Evaluate post biopsy marker clip placement.  EXAM: DIAGNOSTIC RIGHT MAMMOGRAM POST STEREOTACTIC BIOPSY  COMPARISON:  Previous exam(s).  FINDINGS: Mammographic images were obtained following stereotactic guided biopsy of a a right breast architectural distortion. The biopsy marking clip is in expected position at the site of biopsy.  IMPRESSION: Appropriate positioning of the X shaped biopsy marking clip at the site of biopsy in the far lateral right breast.  Final Assessment: Post Procedure Mammograms for Marker Placement   Electronically Signed   By: Lajean Manes M.D.   On: 03/22/2019 13:36  Within last 24 hrs: No results found.  Assessment    Complex sclerosing lesion lateral right breast. Patient Active Problem List   Diagnosis Date Noted  . Uterine leiomyoma 03/16/2018  . Status post LAVH bilateral salpingectomy left oophorectomy 02/14/2018  . Status post LEEP (loop electrosurgical excision procedure) of cervix 03/31/2016  . 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 Endometriosis 10/17/2014  . Anemia, iron deficiency 04/05/2014  . FATIGUE 05/10/2007    Plan    Needle localized right breast lumpectomy/biopsy.  We discussed the reasoning and logic behind why we pursue additional excisional confirmation of the benignity of this lesion.  We discussed the likelihood of finding precancerous or increased risk of cancer diagnosis with excisional biopsy.  I believe she understands the purposes behind  why additional excision is necessary and accepts the risks that are associated with this.  These risks include but are not limited to anesthesia, bleeding, infection, scarring, need for additional procedures and need for additional imaging and follow-up.  I believe she understands these are not all inclusive and desires to proceed.  Face-to-face time spent with the patient and accompanying care providers(if present) was 35 minutes, with more than 50% of the time spent counseling, educating, and coordinating care of the patient.      Ronny Bacon M.D., FACS 05/02/2019, 3:16 PM

## 2019-05-02 NOTE — Patient Instructions (Addendum)
Our surgery scheduler Marzetta Board will contact you within the next 24-48 hours to discuss surgery dates and times and discuss the prep for surgery. Please have the BLUE sheet available when our scheduler Stacy contacts you. If you have any questions or concerns, please give our office a call.   Lumpectomy  A lumpectomy, sometimes called a partial mastectomy, is surgery to remove a cancerous tumor or mass (the lump) from a breast. It is a form of breast-conserving or breast-preservation surgery. This means that the cancerous tissue is removed but the breast remains intact. During a lumpectomy, the portion of the breast that contains the tumor is removed. Some normal tissue around the lump may be taken out to make sure that all of the tumor has been removed. Lymph nodes under your arm may also be removed and tested to find out if the cancer has spread. Lymph nodes are part of the body's disease-fighting system (immune system) and are usually the first place where breast cancer spreads. Tell a health care provider about:  Any allergies you have.  All medicines you are taking, including vitamins, herbs, eye drops, creams, and over-the-counter medicines.  Any problems you or family members have had with anesthetic medicines.  Any blood disorders you have.  Any surgeries you have had.  Any medical conditions you have.  Whether you are pregnant or may be pregnant. What are the risks? Generally, this is a safe procedure. However, problems may occur, including:  Bleeding.  Infection.  Allergic reaction to medicines.  Pain, swelling, weakness, or numbness in the arm on the side of your surgery.  Temporary swelling.  Change in the shape of the breast, particularly if a large portion is removed.  Scar tissue that forms at the surgical site and feels hard to the touch.  Blood clots. What happens before the procedure? Staying hydrated Follow instructions from your health care provider about  hydration, which may include:  Up to 2 hours before the procedure - you may continue to drink clear liquids, such as water, clear fruit juice, black coffee, and plain tea.  Eating and drinking restrictions Follow instructions from your health care provider about eating and drinking, which may include:  8 hours before the procedure - stop eating heavy meals or foods, such as meat, fried foods, or fatty foods.  6 hours before the procedure - stop eating light meals or foods, such as toast or cereal.  6 hours before the procedure - stop drinking milk or drinks that contain milk.  2 hours before the procedure - stop drinking clear liquids. Medicines Ask your health care provider about:  Changing or stopping your regular medicines. This is especially important if you are taking diabetes medicines or blood thinners.  Taking medicines such as aspirin and ibuprofen. These medicines can thin your blood. Do not take these medicines unless your health care provider tells you to take them.  Taking over-the-counter medicines, vitamins, herbs, and supplements. General instructions  Prior to surgery, your health care provider may do a procedure to locate and mark the tumor area in your breast (localization). This will help guide your surgeon to where the incision will be made. This may be done with: ? Imaging, such as a mammogram, ultrasound, or MRI. ? Insertion of a small wire, clip, or seed, or an implant that will reflect a radar signal.  You may have screening tests or exams to get baseline measurements of your arm. These can be compared to measurements done after surgery  to monitor for swelling (lymphedema) that can develop after having lymph nodes removed.  Ask your health care provider: ? How your surgery site will be marked. ? What steps will be taken to help prevent infection. These may include:  Washing skin with a germ-killing soap.  Taking antibiotic medicine.  Plan to have someone  take you home from the hospital or clinic.  Plan to have a responsible adult care for you for at least 24 hours after you leave the hospital or clinic. This is important. What happens during the procedure?   An IV will be inserted into one of your veins.  You will be given one or more of the following: ? A medicine to help you relax (sedative). ? A medicine to numb the area (local anesthetic). ? A medicine to make you fall asleep (general anesthetic).  Your health care provider will use a kind of electric scalpel that uses heat to reduce bleeding (electrocautery knife). A curved incision that follows the natural curve of your breast will be made. This type of incision will allow for minimal scarring and better healing.  The tumor will be removed along with some of the tissue around it. This will be sent to the lab for testing. Your health care provider may also remove lymph nodes at this time if needed.  If the tumor is close to the muscles over your chest, some muscle tissue may also be removed.  A small drain tube may be inserted into your breast area or armpit to collect fluid that may build up after surgery. This tube will be connected to a suction bulb on the outside of your body to remove the fluid.  The incision will be closed with stitches (sutures).  A bandage (dressing) may be placed over the incision. The procedure may vary among health care providers and hospitals. What happens after the procedure?  Your blood pressure, heart rate, breathing rate, and blood oxygen level will be monitored until you leave the hospital or clinic.  You will be given medicine for pain as needed.  Your IV will be removed when you are able to eat and drink by mouth.  You will be encouraged to get up and walk as soon as you can. This is important to improve blood flow and breathing. Ask for help if you feel weak or unsteady.  You may have: ? A drain tube in place for 2-3 days to prevent a  collection of blood (hematoma) from developing in the breast. You will be given instructions about caring for the drain before you go home. ? A pressure bandage applied for 1-2 days to prevent bleeding or swelling. Your pressure bandage may look like a thick piece of fabric or an elastic wrap. Ask your health care provider how to care for your bandage at home.  You may be given a tight sleeve to wear over your arm on the side of your surgery. You should wear this sleeve as told by your health care provider.  Do not drive for 24 hours if you were given a sedative during your procedure. Summary  A lumpectomy, sometimes called a partial mastectomy, is surgery to remove a cancerous tumor or mass (the lump) from a breast.  During a lumpectomy, the portion of the breast that contains the tumor is removed. Lymph nodes under your arm may also be removed and tested to find out if the cancer has spread.  Plan to have someone take you home from the hospital  or clinic.  You may have a drain tube in place for 2-3 days to prevent a collection of blood (hematoma) from developing in the breast. You will be given instructions about caring for the drain before you go home. This information is not intended to replace advice given to you by your health care provider. Make sure you discuss any questions you have with your health care provider. Document Revised: 09/19/2018 Document Reviewed: 09/19/2018 Elsevier Patient Education  [place].

## 2019-05-02 NOTE — Progress Notes (Signed)
Patient ID: Tracy Madden, female   DOB: 1970-06-26, 49 y.o.   MRN: [id number]  Chief Complaint: Complex sclerosing lesion right breast  History of Present Illness Tracy Madden is a 49 y.o. female with no significant family history, or prior breast history who underwent screening mammography, area of architectural distortion noted on the right breast which was biopsied with benign diagnosis of complex sclerosing lesion, no atypia and no malignancy.  She had seen another surgeon, and came here seeking a second opinion.  She reports her only family history of revolves around a uterine fibrosis, denying any known history of breast cancer in any of her female family members.  No history of ovarian cancer as well.  She denies any prior hormonal use.  She has no known palpable breast lump or dermal changes.  She denied any history of breast pain.  Past Medical History Past Medical History:  Diagnosis Date  . Abdominal pain, LLQ   . Abnormal uterine bleeding   . Anemia   . Cervical dysplasia   . Endometriosis   . Fibroid   . Melanoma ([place])    RT SHOULDER  . Paratubal cyst   . Pneumonia   . Prediabetes       Past Surgical History:  Procedure Laterality Date  . BREAST BIOPSY Right 03/22/2019   Affirm Bx-"X" clip-path pending  . CYSTOSCOPY Bilateral 02/14/2018   Procedure: CYSTOSCOPY;  Surgeon: Defrancesco, Alanda Slim, MD;  Location: [facility] ORS;  Service: Gynecology;  Laterality: Bilateral;  . DILATION AND CURETTAGE OF UTERUS    . HYSTEROSCOPY    . LAPAROSCOPIC ASSISTED VAGINAL HYSTERECTOMY N/A 02/14/2018   Procedure: LAPAROSCOPIC ASSISTED VAGINAL HYSTERECTOMY;  Surgeon: [name], MD;  Location: [facility] OR[facility];  Service: Gynecology;  Laterality: N/A;  . LAPAROSCOPIC BILATERAL SALPINGECTOMY Bilateral 02/14/2018   Procedure: LAPAROSCOPIC BILATERAL SALPINGECTOMY;  Surgeon: [name], MD;  Location: [facility] ORS;  Service: Gynecology;  Laterality: Bilateral;  . LAPAROSCOPY      DRAINAGE AND EXCISION OF PARATUBAL CYST  . LEEP  2002  . LEEP N/A 03/16/2016   Procedure: LOOP ELECTROSURGICAL EXCISION PROCEDURE (LEEP);  Surgeon: [name], MD;  Location: [facility] ORS;  Service: Gynecology;  Laterality: N/A;  . NOVASURE ABLATION  2016  . TONSILLECTOMY      Allergies  Allergen Reactions  . Aspirin Shortness Of Breath and Other (See Comments)    Inhale aspirin, no reaction to oral aspirin   . Eggs Or Egg-Derived Products Anaphylaxis  . Niacin Hives, Rash and Other (See Comments)    [place] in a matter of seconds    Current Outpatient Medications  Medication Sig Dispense Refill  . Ascorbic Acid (VITAMIN C PO) Take 1 tablet by mouth daily.    . Cholecalciferol (VITAMIN D3 PO) Take 1 tablet by mouth 3 (three) times a week.    . Multiple Vitamins-Minerals (AIRBORNE GUMMIES PO) Take 2 tablets by mouth daily.    . Multiple Vitamins-Minerals (ZINC PO) Take 1 tablet by mouth daily.     No current facility-administered medications for this visit.    Family History Family History  Problem Relation Age of Onset  . Uterine cancer Mother       Social History Social History   Tobacco Use  . Smoking status: Never Smoker  . Smokeless tobacco: Never Used  Substance Use Topics  . Alcohol use: Yes    Comment: OCCAS  . Drug use: No        Review of Systems  Constitutional: Negative.   HENT: Negative.   Eyes: Negative.   Respiratory: Negative.   Cardiovascular: Negative.   Gastrointestinal: Negative.   Genitourinary: Negative.   Musculoskeletal: Negative.   Skin: Negative.   Neurological: Negative.   Endo/Heme/Allergies: Negative.       Physical Exam Blood pressure 129/82, pulse 75, temperature (!) 95.9 F (35.5 C), temperature source Temporal, resp. rate 12, height 5\' 3"  (1.6 m), weight 167 lb 6.4 oz (75.9 kg), SpO2 94 %. Last Weight  Most recent update: 05/02/2019  2:38 PM   Weight  75.9 kg (167 lb 6.4 oz)            CONSTITUTIONAL: Well  developed, and nourished, appropriately responsive and aware without distress.   EYES: Sclera non-icteric.   EARS, NOSE, MOUTH AND THROAT: Mask worn.    Hearing is intact to voice.  NECK: Trachea is midline, and there is no jugular venous distension.  LYMPH NODES:  Lymph nodes in the neck are not enlarged. RESPIRATORY:  Lungs are clear, and breath sounds are equal bilaterally. Normal respiratory effort without pathologic use of accessory muscles. CARDIOVASCULAR: Heart is regular in rate and rhythm. GI: The abdomen is soft, nontender, and nondistended. There were no palpable masses. I did not appreciate hepatosplenomegaly. There were normal bowel sounds. MUSCULOSKELETAL:  Symmetrical muscle tone appreciated in all four extremities.    SKIN: Skin turgor is normal. No pathologic skin lesions appreciated.  NEUROLOGIC:  Motor and sensation appear grossly normal.  Cranial nerves are grossly without defect. PSYCH:  Alert and oriented to person, place and time. Affect is appropriate for situation.  Data Reviewed I have personally reviewed what is currently available of the patient's imaging, recent labs and medical records.   Labs:  CBC Latest Ref Rng & Units 10/12/2018 02/14/2018 03/12/2016  WBC 3.4 - 10.8 x10E3/uL 6.3 15.1(H) 6.0  Hemoglobin 11.1 - 15.9 g/dL 13.4 13.0 13.7  Hematocrit 34.0 - 46.6 % 39.6 40.7 40.4  Platelets 150 - 450 x10E3/uL 315 289 287   CMP Latest Ref Rng & Units 10/12/2018  Glucose 65 - 99 mg/dL 91  BUN 6 - 24 mg/dL 13  Creatinine 0.57 - 1.00 mg/dL 0.82  Sodium 134 - 144 mmol/L 143  Potassium 3.5 - 5.2 mmol/L 4.2  Chloride 96 - 106 mmol/L 102  CO2 20 - 29 mmol/L 23  Calcium 8.7 - 10.2 mg/dL 9.8  Total Protein 6.0 - 8.5 g/dL 6.5  Total Bilirubin 0.0 - 1.2 mg/dL <0.2  Alkaline Phos 39 - 117 IU/L 72  AST 0 - 40 IU/L 20  ALT 0 - 32 IU/L 14      Imaging: Radiology review:  EXAM: DIGITAL SCREENING BILATERAL MAMMOGRAM WITH TOMO AND CAD  COMPARISON:  Previous  exam(s).  ACR Breast Density Category d: The breast tissue is extremely dense, which lowers the sensitivity of mammography.  FINDINGS: In the right breast, possible distortion warrants further evaluation. In the left breast, no findings suspicious for malignancy. Images were processed with CAD.  IMPRESSION: Further evaluation is suggested for possible distortion in the right breast.  RECOMMENDATION: Diagnostic mammogram and possibly ultrasound of the right breast. (Code:FI-R-80M)  The patient will be contacted regarding the findings, and additional imaging will be scheduled.  BI-RADS CATEGORY  0: Incomplete. Need additional imaging evaluation and/or prior mammograms for comparison.   Electronically Signed   By: Ammie Ferrier M.D.   On: 03/03/2019 13:51  CLINICAL DATA:  Patient recalled from screening for right breast distortion.  EXAM:  DIGITAL DIAGNOSTIC RIGHT MAMMOGRAM WITH CAD AND TOMO  ULTRASOUND RIGHT BREAST  COMPARISON:  Previous exam(s).  ACR Breast Density Category c: The breast tissue is heterogeneously dense, which may obscure small masses.  FINDINGS: Persistent focal distortion demonstrated within the lateral aspect of the right breast middle depth. This distortion is predominately demonstrated on the full paddle true lateral and spot compression MLO tomosynthesis images. The distortion is not as well demonstrated on the exaggerated CC lateral images.  Mammographic images were processed with CAD.  On physical exam, no discrete mass is palpated within the outer right breast.  Targeted ultrasound is performed, showing normal tissue without suspicious mass within the outer right breast.  No right axillary adenopathy.  IMPRESSION: Persistent focal distortion within the outer right breast. Distortion is demonstrated best on the MLO and true lateral views.  RECOMMENDATION: Stereotactic guided core needle biopsy persistent  focal right breast distortion outer right breast.  I have discussed the findings and recommendations with the patient. If applicable, a reminder letter will be sent to the patient regarding the next appointment.  BI-RADS CATEGORY  4: Suspicious.   Electronically Signed   By: Lovey Newcomer M.D.   On: 03/16/2019 16:08  CLINICAL DATA:  Status post stereotactic core needle biopsy an area of architectural distortion in the lateral right breast. Evaluate post biopsy marker clip placement.  EXAM: DIAGNOSTIC RIGHT MAMMOGRAM POST STEREOTACTIC BIOPSY  COMPARISON:  Previous exam(s).  FINDINGS: Mammographic images were obtained following stereotactic guided biopsy of a a right breast architectural distortion. The biopsy marking clip is in expected position at the site of biopsy.  IMPRESSION: Appropriate positioning of the X shaped biopsy marking clip at the site of biopsy in the far lateral right breast.  Final Assessment: Post Procedure Mammograms for Marker Placement   Electronically Signed   By: Lajean Manes M.D.   On: 03/22/2019 13:36  Within last 24 hrs: No results found.  Assessment    Complex sclerosing lesion lateral right breast. Patient Active Problem List   Diagnosis Date Noted  . Uterine leiomyoma 03/16/2018  . Status post LAVH bilateral salpingectomy left oophorectomy 02/14/2018  . Status post LEEP (loop electrosurgical excision procedure) of cervix 03/31/2016  . 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 Endometriosis 10/17/2014  . Anemia, iron deficiency 04/05/2014  . FATIGUE 05/10/2007    Plan    Needle localized right breast lumpectomy/biopsy.  We discussed the reasoning and logic behind why we pursue additional excisional confirmation of the benignity of this lesion.  We discussed the likelihood of finding precancerous or increased risk of cancer diagnosis with excisional biopsy.  I believe she understands the purposes behind  why additional excision is necessary and accepts the risks that are associated with this.  These risks include but are not limited to anesthesia, bleeding, infection, scarring, need for additional procedures and need for additional imaging and follow-up.  I believe she understands these are not all inclusive and desires to proceed.  Face-to-face time spent with the patient and accompanying care providers(if present) was 35 minutes, with more than 50% of the time spent counseling, educating, and coordinating care of the patient.      Ronny Bacon M.D., FACS 05/02/2019, 3:16 PM

## 2019-05-09 ENCOUNTER — Telehealth: Payer: Self-pay | Admitting: Surgery

## 2019-05-09 ENCOUNTER — Other Ambulatory Visit: Payer: Self-pay | Admitting: Surgery

## 2019-05-09 DIAGNOSIS — N6489 Other specified disorders of breast: Secondary | ICD-10-CM

## 2019-05-09 NOTE — Telephone Encounter (Signed)
Outbound call made to the patient on 05/04/19, in an attempt to clarify / confirm if she wished to proceed w/scheduling surgery w/Dr. Christian Mate whom she saw for a second opinion on 05/02/19, or if she was going to continue w/the existing surgery scheduled 06/01/19 w/Dr. Windell Moment.  At this time Tracy Madden indicated she would like to move forward w/Dr. Christian Mate & scheduling in early March.  A follow up call was made to the patient on 05/08/19, advising the office cannot proceed w/scheduling her surgery until she contacts Dr. Darrol Poke office to cancel the existing case.  Tracy Madden is aware that the tentative surgical date of 05/31/19 is not finalized until the surgery is scheduled & this cannot happen until the original is cancelled.  She acknowledged understanding & thanked, advising she will call the office to cxl.  Per her appointment desk, her surgery, pre-admit & COVID testing through Dr. Darrol Poke office are still active, therefore surgery scheduling by [facility] will be placed on hold.  Thank you

## 2019-05-09 NOTE — Telephone Encounter (Signed)
Please disregard, the surgery has been cancelled through the alternate office so scheduling will be handled @ [facility] moving forward.

## 2019-05-10 ENCOUNTER — Telehealth: Payer: Self-pay | Admitting: Surgery

## 2019-05-10 NOTE — Telephone Encounter (Signed)
Pt has been advised of pre admission date/time, Covid Testing date and Surgery date.  Surgery Date: 05/31/19 w/an 8:30 am arrival time @ [facility] Preadmission Testing Date: 05/26/19 (8a-1p) Covid Testing Date: 05/29/19 - patient advised to go to the [facility] ([place])  Patient has been made aware to call [phone], between 1-3:00pm the day before surgery, to find out what time to arrive.

## 2019-05-17 ENCOUNTER — Telehealth: Payer: Self-pay | Admitting: *Deleted

## 2019-05-17 NOTE — Telephone Encounter (Signed)
Patient was seen in [facility] in [place] by [place] PA , found enlarged lymph node in axillary, she is having surgery on March 3rd for right breast lumpectomy. She is wanting to let Dr.Rodenberg know for surgery

## 2019-05-23 ENCOUNTER — Other Ambulatory Visit: Payer: Commercial Managed Care - PPO

## 2019-05-23 ENCOUNTER — Telehealth: Payer: Self-pay | Admitting: *Deleted

## 2019-05-23 NOTE — Telephone Encounter (Signed)
FMLA Faxed to [facility] [phone]

## 2019-05-26 ENCOUNTER — Other Ambulatory Visit: Payer: Self-pay

## 2019-05-26 ENCOUNTER — Other Ambulatory Visit
Admission: RE | Admit: 2019-05-26 | Discharge: 2019-05-26 | Disposition: A | Discharge status: Home / Self Care | Payer: Commercial Managed Care - PPO | Source: Ambulatory Visit | Attending: Surgery | Admitting: Surgery

## 2019-05-26 DIAGNOSIS — Z01812 Encounter for preprocedural laboratory examination: Secondary | ICD-10-CM | POA: Insufficient documentation

## 2019-05-26 NOTE — Patient Instructions (Signed)
Your procedure is scheduled on: Wednesday 05/31/19.  Report to DAY SURGERY DEPARTMENT LOCATED ON 2ND FLOOR MEDICAL MALL ENTRANCE. To find out your arrival time please call [phone] between 1PM - 3PM on Tuesday 05/30/19.  Remember: Instructions that are not followed completely may result in serious medical risk, up to and including death, or upon the discretion of your surgeon and anesthesiologist your surgery may need to be rescheduled.      _X__ 1. Do not eat food after midnight the night before your procedure.                 No gum chewing or hard candies. You may drink clear liquids up to 2 hours                 before you are scheduled to arrive for your surgery- DO NOT drink clear                 liquids within 2 hours of the start of your surgery.                 Clear Liquids include:  water, apple juice without pulp, clear carbohydrate                 drink such as Clearfast or Gatorade, Black Coffee or Tea (Do not add                 anything to coffee or tea).   __X__2.  On the morning of surgery brush your teeth with toothpaste and water, you may rinse your mouth with mouthwash if you wish.  Do not swallow any toothpaste or mouthwash.       _X__ 3.  No Alcohol for 24 hours before or after surgery.     _X__ 4.  Do Not Smoke or use e-cigarettes For 24 Hours Prior to Your Surgery.                 Do not use any chewable tobacco products for at least 6 hours prior to                 Surgery.    __X__5.  Notify your doctor if there is any change in your medical condition      (cold, fever, infections).       Do not wear jewelry, make-up, hairpins, clips or nail polish. Do not wear lotions, powders, or perfumes.  Do not shave 48 hours prior to surgery. Men may shave face and neck. Do not bring valuables to the hospital.      [facility] is not responsible for any belongings or valuables.    Contacts, dentures/partials or body piercings may not be worn into  surgery. Bring a case for your contacts, glasses or hearing aids, a denture cup will be supplied.     Patients discharged the day of surgery will not be allowed to drive home.     __X__ Take these medicines the morning of surgery with A SIP OF WATER:     1. NONE    __X__ Use CHG Soap as directed.    __X__ Stop Anti-inflammatories 7 days before surgery such as Advil, Ibuprofen, Motrin, BC or Goodies Powder, Naprosyn, Naproxen, Aleve, Aspirin, Meloxicam. May take Tylenol if needed for pain or discomfort.

## 2019-05-29 ENCOUNTER — Other Ambulatory Visit
Admission: RE | Admit: 2019-05-29 | Discharge: 2019-05-29 | Disposition: A | Discharge status: Home / Self Care | Payer: Commercial Managed Care - PPO | Source: Ambulatory Visit | Attending: Surgery | Admitting: Surgery

## 2019-05-29 ENCOUNTER — Other Ambulatory Visit: Payer: Self-pay

## 2019-05-29 DIAGNOSIS — Z01812 Encounter for preprocedural laboratory examination: Secondary | ICD-10-CM | POA: Insufficient documentation

## 2019-05-29 DIAGNOSIS — Z20822 Contact with and (suspected) exposure to covid-19: Secondary | ICD-10-CM | POA: Diagnosis not present

## 2019-05-29 LAB — SARS CORONAVIRUS 2 (TAT 6-24 HRS): SARS Coronavirus 2: NEGATIVE

## 2019-05-30 ENCOUNTER — Other Ambulatory Visit: Payer: Commercial Managed Care - PPO

## 2019-05-31 ENCOUNTER — Ambulatory Visit
Admission: RE | Admit: 2019-05-31 | Discharge: 2019-05-31 | Disposition: A | Discharge status: Home / Self Care | Payer: Commercial Managed Care - PPO | Source: Ambulatory Visit | Attending: Surgery | Admitting: Surgery

## 2019-05-31 ENCOUNTER — Other Ambulatory Visit: Payer: Self-pay

## 2019-05-31 ENCOUNTER — Telehealth: Payer: Self-pay | Admitting: Surgery

## 2019-05-31 ENCOUNTER — Ambulatory Visit: Payer: Commercial Managed Care - PPO | Admitting: Anesthesiology

## 2019-05-31 ENCOUNTER — Ambulatory Visit
Admission: RE | Admit: 2019-05-31 | Discharge: 2019-05-31 | Disposition: A | Discharge status: Home / Self Care | Payer: Commercial Managed Care - PPO | Attending: Surgery | Admitting: Surgery

## 2019-05-31 ENCOUNTER — Encounter: Payer: Self-pay | Admitting: Surgery

## 2019-05-31 ENCOUNTER — Encounter
Admission: RE | Disposition: A | Discharge status: Home / Self Care | Payer: Self-pay | Source: Home / Self Care | Attending: Surgery

## 2019-05-31 DIAGNOSIS — N6489 Other specified disorders of breast: Secondary | ICD-10-CM

## 2019-05-31 DIAGNOSIS — N6021 Fibroadenosis of right breast: Secondary | ICD-10-CM

## 2019-05-31 DIAGNOSIS — Z85820 Personal history of malignant melanoma of skin: Secondary | ICD-10-CM | POA: Insufficient documentation

## 2019-05-31 DIAGNOSIS — R928 Other abnormal and inconclusive findings on diagnostic imaging of breast: Secondary | ICD-10-CM

## 2019-05-31 HISTORY — PX: BREAST LUMPECTOMY WITH NEEDLE LOCALIZATION: SHX5759

## 2019-05-31 HISTORY — PX: BREAST EXCISIONAL BIOPSY: SUR124

## 2019-05-31 SURGERY — BREAST LUMPECTOMY WITH NEEDLE LOCALIZATION
Anesthesia: General | Site: Breast | Laterality: Right

## 2019-05-31 MED ORDER — OXYCODONE HCL 5 MG PO TABS: 5.0000 mg | ORAL_TABLET | Freq: Once | ORAL | Status: DC | PRN

## 2019-05-31 MED ORDER — CEFAZOLIN SODIUM-DEXTROSE 2-4 GM/100ML-% IV SOLN
INTRAVENOUS | Status: AC
  Filled 2019-05-31: qty 100

## 2019-05-31 MED ORDER — CEFAZOLIN SODIUM-DEXTROSE 2-4 GM/100ML-% IV SOLN
2.0000 g | INTRAVENOUS | Status: AC
  Administered 2019-05-31: 2 g via INTRAVENOUS

## 2019-05-31 MED ORDER — PROPOFOL 10 MG/ML IV BOLUS
INTRAVENOUS | Status: AC
  Filled 2019-05-31: qty 20

## 2019-05-31 MED ORDER — MIDAZOLAM HCL 2 MG/2ML IJ SOLN
INTRAMUSCULAR | Status: AC
  Filled 2019-05-31: qty 2

## 2019-05-31 MED ORDER — LIDOCAINE HCL (CARDIAC) PF 100 MG/5ML IV SOSY
PREFILLED_SYRINGE | INTRAVENOUS | Status: DC | PRN
  Administered 2019-05-31: 80 mg via INTRAVENOUS

## 2019-05-31 MED ORDER — ACETAMINOPHEN 500 MG PO TABS: 1000.0000 mg | ORAL_TABLET | ORAL | Status: AC

## 2019-05-31 MED ORDER — ONDANSETRON HCL 4 MG/2ML IJ SOLN
INTRAMUSCULAR | Status: DC | PRN
  Administered 2019-05-31: 4 mg via INTRAVENOUS

## 2019-05-31 MED ORDER — FAMOTIDINE 20 MG PO TABS: 20.0000 mg | ORAL_TABLET | Freq: Once | ORAL | Status: AC

## 2019-05-31 MED ORDER — ONDANSETRON HCL 4 MG/2ML IJ SOLN
INTRAMUSCULAR | Status: AC
  Filled 2019-05-31: qty 2

## 2019-05-31 MED ORDER — FENTANYL CITRATE (PF) 100 MCG/2ML IJ SOLN
INTRAMUSCULAR | Status: DC | PRN
  Administered 2019-05-31: 25 ug via INTRAVENOUS
  Administered 2019-05-31: 50 ug via INTRAVENOUS

## 2019-05-31 MED ORDER — PROPOFOL 10 MG/ML IV BOLUS
INTRAVENOUS | Status: DC | PRN
  Administered 2019-05-31: 150 mg via INTRAVENOUS
  Administered 2019-05-31: 50 mg via INTRAVENOUS

## 2019-05-31 MED ORDER — FAMOTIDINE 20 MG PO TABS
ORAL_TABLET | ORAL | Status: AC
  Administered 2019-05-31: 20 mg via ORAL
  Filled 2019-05-31: qty 1

## 2019-05-31 MED ORDER — PHENYLEPHRINE HCL (PRESSORS) 10 MG/ML IV SOLN
INTRAVENOUS | Status: DC | PRN
  Administered 2019-05-31 (×2): 100 ug via INTRAVENOUS

## 2019-05-31 MED ORDER — BUPIVACAINE-EPINEPHRINE 0.25% -1:200000 IJ SOLN
INTRAMUSCULAR | Status: DC | PRN
  Administered 2019-05-31: 18 mL

## 2019-05-31 MED ORDER — PROMETHAZINE HCL 25 MG/ML IJ SOLN: 6.2500 mg | INTRAMUSCULAR | Status: DC | PRN

## 2019-05-31 MED ORDER — LIDOCAINE HCL (PF) 2 % IJ SOLN
INTRAMUSCULAR | Status: AC
  Filled 2019-05-31: qty 5

## 2019-05-31 MED ORDER — DEXAMETHASONE SODIUM PHOSPHATE 10 MG/ML IJ SOLN
INTRAMUSCULAR | Status: DC | PRN
  Administered 2019-05-31: 10 mg via INTRAVENOUS

## 2019-05-31 MED ORDER — BUPIVACAINE HCL (PF) 0.25 % IJ SOLN
INTRAMUSCULAR | Status: AC
  Filled 2019-05-31: qty 30

## 2019-05-31 MED ORDER — EPINEPHRINE PF 1 MG/ML IJ SOLN
INTRAMUSCULAR | Status: AC
  Filled 2019-05-31: qty 1

## 2019-05-31 MED ORDER — ACETAMINOPHEN 500 MG PO TABS
ORAL_TABLET | ORAL | Status: AC
  Administered 2019-05-31: 1000 mg via ORAL
  Filled 2019-05-31: qty 2

## 2019-05-31 MED ORDER — MIDAZOLAM HCL 2 MG/2ML IJ SOLN
INTRAMUSCULAR | Status: DC | PRN
  Administered 2019-05-31: 2 mg via INTRAVENOUS

## 2019-05-31 MED ORDER — CHLORHEXIDINE GLUCONATE CLOTH 2 % EX PADS
6.0000 | MEDICATED_PAD | Freq: Once | CUTANEOUS | Status: AC
  Administered 2019-05-31: 6 via TOPICAL

## 2019-05-31 MED ORDER — FENTANYL CITRATE (PF) 100 MCG/2ML IJ SOLN: 25.0000 ug | INTRAMUSCULAR | Status: DC | PRN

## 2019-05-31 MED ORDER — OXYCODONE HCL 5 MG/5ML PO SOLN: 5.0000 mg | Freq: Once | ORAL | Status: DC | PRN

## 2019-05-31 MED ORDER — HYDROCODONE-ACETAMINOPHEN 5-325 MG PO TABS: 1.0000 | ORAL_TABLET | Freq: Four times a day (QID) | ORAL | 0 refills | Status: DC | PRN

## 2019-05-31 MED ORDER — MEPERIDINE HCL 50 MG/ML IJ SOLN: 6.2500 mg | INTRAMUSCULAR | Status: DC | PRN

## 2019-05-31 MED ORDER — EPHEDRINE SULFATE 50 MG/ML IJ SOLN
INTRAMUSCULAR | Status: DC | PRN
  Administered 2019-05-31: 5 mg via INTRAVENOUS

## 2019-05-31 MED ORDER — HYDROCODONE-ACETAMINOPHEN 5-325 MG PO TABS: 2.0000 | ORAL_TABLET | Freq: Four times a day (QID) | ORAL | 0 refills | Status: DC | PRN

## 2019-05-31 MED ORDER — SEVOFLURANE IN SOLN
RESPIRATORY_TRACT | Status: AC
  Filled 2019-05-31: qty 250

## 2019-05-31 MED ORDER — FENTANYL CITRATE (PF) 100 MCG/2ML IJ SOLN
INTRAMUSCULAR | Status: AC
  Filled 2019-05-31: qty 2

## 2019-05-31 MED ORDER — GLYCOPYRROLATE 0.2 MG/ML IJ SOLN
INTRAMUSCULAR | Status: AC
  Filled 2019-05-31: qty 1

## 2019-05-31 MED ORDER — LACTATED RINGERS IV SOLN: INTRAVENOUS | Status: DC

## 2019-05-31 MED ORDER — GLYCOPYRROLATE 0.2 MG/ML IJ SOLN
INTRAMUSCULAR | Status: DC | PRN
  Administered 2019-05-31: .2 mg via INTRAVENOUS

## 2019-05-31 MED ORDER — DEXAMETHASONE SODIUM PHOSPHATE 10 MG/ML IJ SOLN
INTRAMUSCULAR | Status: AC
  Filled 2019-05-31: qty 1

## 2019-05-31 MED ORDER — FENTANYL CITRATE (PF) 100 MCG/2ML IJ SOLN
INTRAMUSCULAR | Status: AC
  Administered 2019-05-31: 50 ug via INTRAVENOUS
  Filled 2019-05-31: qty 2

## 2019-05-31 SURGICAL SUPPLY — 33 items
BLADE SURG 15 STRL LF DISP TIS (BLADE) ×1 IMPLANT
BLADE SURG 15 STRL SS (BLADE) ×1
CANISTER SUCT 1200ML W/VALVE (MISCELLANEOUS) ×2 IMPLANT
CHLORAPREP W/TINT 26 (MISCELLANEOUS) ×2 IMPLANT
CNTNR SPEC 2.5X3XGRAD LEK (MISCELLANEOUS)
CONT SPEC 4OZ STER OR WHT (MISCELLANEOUS)
CONTAINER SPEC 2.5X3XGRAD LEK (MISCELLANEOUS) IMPLANT
COVER WAND RF STERILE (DRAPES) ×2 IMPLANT
DECANTER SPIKE VIAL GLASS SM (MISCELLANEOUS) ×2 IMPLANT
DERMABOND ADVANCED (GAUZE/BANDAGES/DRESSINGS) ×1
DERMABOND ADVANCED .7 DNX12 (GAUZE/BANDAGES/DRESSINGS) ×1 IMPLANT
DEVICE DUBIN SPECIMEN MAMMOGRA (MISCELLANEOUS) ×2 IMPLANT
DRAPE LAPAROTOMY TRNSV 106X77 (MISCELLANEOUS) ×2 IMPLANT
ELECT CAUTERY BLADE TIP 2.5 (TIP) ×2
ELECT REM PT RETURN 9FT ADLT (ELECTROSURGICAL) ×2
ELECTRODE CAUTERY BLDE TIP 2.5 (TIP) ×1 IMPLANT
ELECTRODE REM PT RTRN 9FT ADLT (ELECTROSURGICAL) ×1 IMPLANT
GLOVE ORTHO TXT STRL SZ7.5 (GLOVE) ×10 IMPLANT
GOWN STRL REUS W/ TWL LRG LVL3 (GOWN DISPOSABLE) ×3 IMPLANT
GOWN STRL REUS W/TWL LRG LVL3 (GOWN DISPOSABLE) ×3
KIT MARKER MARGIN INK (KITS) ×2 IMPLANT
KIT TURNOVER KIT A (KITS) ×2 IMPLANT
MARKER MARGIN CORRECT CLIP (MARKER) IMPLANT
NEEDLE HYPO 22GX1.5 SAFETY (NEEDLE) ×2 IMPLANT
PACK BASIN MINOR ARMC (MISCELLANEOUS) ×2 IMPLANT
SHEARS HARMONIC 9CM CVD (BLADE) IMPLANT
SUT MNCRL 4-0 (SUTURE) ×1
SUT MNCRL 4-0 27XMFL (SUTURE) ×1
SUT VIC AB 3-0 SH 27 (SUTURE) ×2
SUT VIC AB 3-0 SH 27X BRD (SUTURE) ×2 IMPLANT
SUTURE MNCRL 4-0 27XMF (SUTURE) ×1 IMPLANT
SYR 10ML LL (SYRINGE) ×2 IMPLANT
WATER STERILE IRR 1000ML POUR (IV SOLUTION) ×2 IMPLANT

## 2019-05-31 NOTE — Discharge Instructions (Signed)
AMBULATORY SURGERY  DISCHARGE INSTRUCTIONS   1) The drugs that you were given will stay in your system until tomorrow so for the next 24 hours you should not:  A) Drive an automobile B) Make any legal decisions C) Drink any alcoholic beverage   2) You may resume regular meals tomorrow.  Today it is better to start with liquids and gradually work up to solid foods.  You may eat anything you prefer, but it is better to start with liquids, then soup and crackers, and gradually work up to solid foods.   3) Please notify your doctor immediately if you have any unusual bleeding, trouble breathing, redness and pain at the surgery site, drainage, fever, or pain not relieved by medication. 4)   5) Your post-operative visit with Dr.                                     is: Date:                        Time:    Please call to schedule your post-operative visit.  6) Additional Instructions:     Your incision was closed with Dermabond.  It is best to keep it clean and dry, it will tolerate a brief shower, but do not soak it or apply any creams or lotions to the incisions.  The Dermabond should gradually flake off over time.  Keep it open to air so you can evaluate your incisions.  Dermabond assists the underlying sutures to keep your incision closed and protected from infection.  Should you develop some drainage from your incision, some drops of drainage would be okay but if it persists continue to put keep a dry dressing over it.

## 2019-05-31 NOTE — Anesthesia Preprocedure Evaluation (Addendum)
Anesthesia Evaluation  Patient identified by MRN, date of birth, ID band Patient awake    Reviewed: Allergy & Precautions, H&P , NPO status , Patient's Chart, lab work & pertinent test results  History of Anesthesia Complications Negative for: history of anesthetic complications  Airway Mallampati: II  TM Distance: >3 FB Neck ROM: Full    Dental no notable dental hx.    Pulmonary neg pulmonary ROS, neg sleep apnea, neg COPD,    breath sounds clear to auscultation- rhonchi (-) wheezing      Cardiovascular Exercise Tolerance: Good (-) hypertension(-) CAD and (-) Past MI negative cardio ROS   Rhythm:Regular Rate:Normal - Systolic murmurs and - Diastolic murmurs    Neuro/Psych negative neurological ROS  negative psych ROS   GI/Hepatic negative GI ROS, Neg liver ROS,   Endo/Other  negative endocrine ROSneg diabetes  Renal/GU negative Renal ROS     Musculoskeletal negative musculoskeletal ROS (+)   Abdominal (+) - obese,   Peds  Hematology  (+) anemia ,   Anesthesia Other Findings Past Medical History: No date: Abdominal pain, LLQ No date: Abnormal uterine bleeding No date: Anemia No date: Cervical dysplasia No date: Endometriosis No date: Fibroid No date: Melanoma (HCC)     Comment:  RT SHOULDER No date: Paratubal cyst No date: Pneumonia No date: Prediabetes  Past Surgical History: 03/22/2019: BREAST BIOPSY; Right     Comment:  Affirm Bx-"X" clip-path pending 02/14/2018: CYSTOSCOPY; Bilateral     Comment:  Procedure: CYSTOSCOPY;  Surgeon: [name],               MD;  Location: [facility];  Service: Gynecology;                Laterality: Bilateral; No date: DILATION AND CURETTAGE OF UTERUS No date: HYSTEROSCOPY 02/14/2018: LAPAROSCOPIC ASSISTED VAGINAL HYSTERECTOMY; N/A     Comment:  Procedure: LAPAROSCOPIC ASSISTED VAGINAL HYSTERECTOMY;                Surgeon: [name], MD;   Location: [facility];               Service: Gynecology;  Laterality: N/A; 02/14/2018: LAPAROSCOPIC BILATERAL SALPINGECTOMY; Bilateral     Comment:  Procedure: LAPAROSCOPIC BILATERAL SALPINGECTOMY;                Surgeon: [name], MD;  Location: [facility];               Service: Gynecology;  Laterality: Bilateral; No date: LAPAROSCOPY     Comment:  DRAINAGE AND EXCISION OF PARATUBAL CYST 2002: LEEP 03/16/2016: LEEP; N/A     Comment:  Procedure: LOOP ELECTROSURGICAL EXCISION PROCEDURE               (LEEP);  Surgeon: [name], MD;  Location:               [facility];  Service: Gynecology;  Laterality: N/A; 2016: NOVASURE ABLATION No date: TONSILLECTOMY     Reproductive/Obstetrics negative OB ROS                            Anesthesia Physical Anesthesia Plan  ASA: II  Anesthesia Plan: General LMA   Post-op Pain Management:    Induction:   PONV Risk Score and Plan: 2 and Dexamethasone, Ondansetron, Midazolam and Treatment may vary due to age or medical condition  Airway Management Planned:   Additional Equipment:  Intra-op Plan:   Post-operative Plan:   Informed Consent: I have reviewed the patients History and Physical, chart, labs and discussed the procedure including the risks, benefits and alternatives for the proposed anesthesia with the patient or authorized representative who has indicated his/her understanding and acceptance.     Dental Advisory Given  Plan Discussed with: Anesthesiologist  Anesthesia Plan Comments:        Anesthesia Quick Evaluation

## 2019-05-31 NOTE — Op Note (Signed)
  Procedure Date:  05/31/2019  Pre-operative Diagnosis: Complex sclerosing lesion, right breast  Post-operative Diagnosis: Same  Procedure: Right breast wire-localized lumpectomy, utilization of Hologic localizer.  Surgeon:  Ronny Bacon, M.D., [facility]  Anesthesia:  General endotracheal  Estimated Blood Loss: 10 ml  Specimens: Central right breast tissue.  Complications: None.  Indications for Procedure:  This is a 49 y.o. female who presents with history of complex sclerosing lesion on core biopsy of area of architectural distortion deep central right breast.  The risks of bleeding, infection, injury to surrounding structures, hematoma, seroma, open wound, cosmetic deformity, and the need for further surgery were all discussed with the patient and was willing to proceed.  Prior to this procedure, the patient had undergone wire localization.  Description of Procedure: The patient was correctly identified in the preoperative area and brought into the operating room.  The patient was placed supine with VTE prophylaxis in place.  Appropriate time-outs were performed.  Anesthesia was induced and the patient was intubated.  Appropriate antibiotics were infused.  The right chest and axilla were prepped and draped in usual sterile fashion.   Attention was turned to the needle localization site.  Utilizing the Hologic localizer donut ring identified that a 6:00 centered circumareolar incision would be where an incision was made either at a strategically determined site for cosmesis, and most direct access to the targeted lesion . Elecrocautery was used for dissection around the superficial tissues, and then scissor dissection is used to perform a lumpectomy with adequate margins.    I utilized the Hologic localizer to identify the lesion, unfortunately I divided the guidewire while dissecting out the specimen.  The specmen's orientation is maintained and the the 6 surfaces painted as designated on the  accompanying index.  The specimen including the wire was placed in the Faxitron which confirmed an absent wire and prior biopsy site clip and the RF ID tag.  The cavity was irrigated and hemostasis was assured with electrocautery.   The wound was then closed in two layers with 3-0 Vicryl and 4-0 Monocryl and sealed with DermaBond.  Local anesthetic was infiltrated into the the cavity, as depot.   The patient was emerged from anesthesia and extubated and brought to the recovery room for further management.  The patient tolerated the procedure well and all counts were correct at the end of the case.   Ronny Bacon, M.D., [facility] 05/31/2019

## 2019-05-31 NOTE — Telephone Encounter (Signed)
Patient is calling and is asking for an rx that was supposed to be called into the pharmacy to be recalled in due to the pharmacy not having the rx. Please call and advise.

## 2019-05-31 NOTE — Transfer of Care (Signed)
Immediate Anesthesia Transfer of Care Note  Patient: Tracy Madden  Procedure(s) Performed: Right BREAST LUMPECTOMY WITH NEEDLE LOCALIZATION (Right Breast)  Patient Location: PACU  Anesthesia Type:General  Level of Consciousness: drowsy, patient cooperative and responds to stimulation  Airway & Oxygen Therapy: Patient Spontanous Breathing and Patient connected to face mask oxygen  Post-op Assessment: Report given to RN and Post -op Vital signs reviewed and stable  Post vital signs: Reviewed and stable  Last Vitals:  Vitals Value Taken Time  BP 120/74 05/31/19 1438  Temp    Pulse 91 05/31/19 1439  Resp 13 05/31/19 1439  SpO2 100 % 05/31/19 1439  Vitals shown include unvalidated device data.  Last Pain:  Vitals:   05/31/19 0951  TempSrc: Temporal  PainSc: 0-No pain         Complications: No apparent anesthesia complications

## 2019-05-31 NOTE — Telephone Encounter (Signed)
Pt states Dr Christian Mate sent in a Norco rx to [facility] on [address] today but that they did not receive it. I called the pharmacy to double check and see if they received anything but they did not.  Sent a message to Dr Christian Mate to see how he wants to proceed whether to resend it or print a hard copy.  Made pt aware that Dr Christian Mate is in sx at this time and unsure when he will be able to answer. Pt understands and would like a call back tomorrow when we find out what he does in regards to the rx.

## 2019-05-31 NOTE — Interval H&P Note (Signed)
History and Physical Interval Note:  05/31/2019 12:53 PM  Tracy Madden  has presented today for surgery, with the diagnosis of Sclerosing lesion of right breast.  The various methods of treatment have been discussed with the patient and family. After consideration of risks, benefits and other options for treatment, the patient has consented to  Procedure(s): Right BREAST LUMPECTOMY WITH NEEDLE LOCALIZATION (Right) as a surgical intervention.  The patient's history has been reviewed, patient examined, no change in status, stable for surgery.  I have reviewed the patient's chart and labs.  Questions were answered to the patient's satisfaction.   Right breast is marked as the correct side.   Ronny Bacon

## 2019-05-31 NOTE — Anesthesia Procedure Notes (Signed)
Procedure Name: LMA Insertion Date/Time: 05/31/2019 1:02 PM Performed by: Doreen Salvage, CRNA Pre-anesthesia Checklist: Patient identified, Patient being monitored, Timeout performed, Emergency Drugs available and Suction available Patient Re-evaluated:Patient Re-evaluated prior to induction Oxygen Delivery Method: Circle system utilized Preoxygenation: Pre-oxygenation with 100% oxygen Induction Type: IV induction Ventilation: Mask ventilation without difficulty LMA: LMA inserted LMA Size: 3.5 Tube type: Oral Number of attempts: 1 Placement Confirmation: positive ETCO2 and breath sounds checked- equal and bilateral Tube secured with: Tape Dental Injury: Teeth and Oropharynx as per pre-operative assessment

## 2019-06-01 ENCOUNTER — Ambulatory Visit: Admit: 2019-06-01 | Payer: Commercial Managed Care - PPO | Admitting: General Surgery

## 2019-06-01 ENCOUNTER — Ambulatory Visit: Payer: Commercial Managed Care - PPO

## 2019-06-01 SURGERY — EXCISION OF BREAST BIOPSY
Anesthesia: General | Site: Breast | Laterality: Right

## 2019-06-01 NOTE — Telephone Encounter (Signed)
Dr Christian Mate resent in rx yesterday. Per pt pharmacy called her yesterday that rx was ready for pick up. Talked to pt about this and she states she did get rx yesterday.

## 2019-06-02 LAB — SURGICAL PATHOLOGY

## 2019-06-02 NOTE — Anesthesia Postprocedure Evaluation (Signed)
Anesthesia Post Note  Patient: Tracy Madden  Procedure(s) Performed: Right BREAST LUMPECTOMY WITH NEEDLE LOCALIZATION (Right Breast)  Patient location during evaluation: PACU Anesthesia Type: General Level of consciousness: awake and alert and oriented Pain management: pain level controlled Vital Signs Assessment: post-procedure vital signs reviewed and stable Respiratory status: spontaneous breathing, nonlabored ventilation and respiratory function stable Cardiovascular status: blood pressure returned to baseline and stable Postop Assessment: no signs of nausea or vomiting Anesthetic complications: no     Last Vitals:  Vitals:   05/31/19 1538 05/31/19 1547  BP: 123/68 121/72  Pulse: 64 64  Resp: 18 16  Temp: 36.7 C (!) 36.2 C  SpO2: 96% 94%    Last Pain:  Vitals:   06/01/19 0840  TempSrc:   PainSc: 0-No pain                 Sirius Woodford

## 2019-06-08 ENCOUNTER — Other Ambulatory Visit: Payer: Self-pay

## 2019-06-08 ENCOUNTER — Ambulatory Visit (INDEPENDENT_AMBULATORY_CARE_PROVIDER_SITE_OTHER): Payer: Self-pay | Admitting: Surgery

## 2019-06-08 ENCOUNTER — Encounter: Payer: Self-pay | Admitting: Surgery

## 2019-06-08 VITALS — BP 104/71 | HR 67 | Temp 96.6°F | Ht 62.0 in | Wt 162.0 lb

## 2019-06-08 DIAGNOSIS — N6021 Fibroadenosis of right breast: Secondary | ICD-10-CM

## 2019-06-08 NOTE — Patient Instructions (Addendum)
Dr.Rodenberg suggested patient to have a follow up Mammogram in 6 months along with a follow up with office visit.   Breast Self-Awareness Breast self-awareness means being familiar with how your breasts look and feel. It involves checking your breasts regularly and reporting any changes to your health care provider. Practicing breast self-awareness is important. Sometimes changes may not be harmful (are benign), but sometimes a change in your breasts can be a sign of a serious medical problem. It is important to learn how to do this procedure correctly so that you can catch problems early, when treatment is more likely to be successful. All women should practice breast self-awareness, including women who have had breast implants. What you need:  A mirror.  A well-lit room. How to do a breast self-exam A breast self-exam is one way to learn what is normal for your breasts and whether your breasts are changing. To do a breast self-exam: Look for changes  1. Remove all the clothing above your waist. 2. Stand in front of a mirror in a room with good lighting. 3. Put your hands on your hips. 4. Push your hands firmly downward. 5. Compare your breasts in the mirror. Look for differences between them (asymmetry), such as: ? Differences in shape. ? Differences in size. ? Puckers, dips, and bumps in one breast and not the other. 6. Look at each breast for changes in the skin, such as: ? Redness. ? Scaly areas. 7. Look for changes in your nipples, such as: ? Discharge. ? Bleeding. ? Dimpling. ? Redness. ? A change in position. Feel for changes Carefully feel your breasts for lumps and changes. It is best to do this while lying on your back on the floor, and again while sitting or standing in the tub or shower with soapy water on your skin. Feel each breast in the following way: 1. Place the arm on the side of the breast you are examining above your head. 2. Feel your breast with the other  hand. 3. Start in the nipple area and make -inch (2 cm) overlapping circles to feel your breast. Use the pads of your three middle fingers to do this. Apply light pressure, then medium pressure, then firm pressure. The light pressure will allow you to feel the tissue closest to the skin. The medium pressure will allow you to feel the tissue that is a little deeper. The firm pressure will allow you to feel the tissue close to the ribs. 4. Continue the overlapping circles, moving downward over the breast until you feel your ribs below your breast. 5. Move one finger-width toward the center of the body. Continue to use the -inch (2 cm) overlapping circles to feel your breast as you move slowly up toward your collarbone. 6. Continue the up-and-down exam using all three pressures until you reach your armpit.  Write down what you find Writing down what you find can help you remember what to discuss with your health care provider. Write down:  What is normal for each breast.  Any changes that you find in each breast, including: ? The kind of changes you find. ? Any pain or tenderness. ? Size and location of any lumps.  Where you are in your menstrual cycle, if you are still menstruating. General tips and recommendations  Examine your breasts every month.  If you are breastfeeding, the best time to examine your breasts is after a feeding or after using a breast pump.  If you menstruate, the  best time to examine your breasts is 5-7 days after your period. Breasts are generally lumpier during menstrual periods, and it may be more difficult to notice changes.  With time and practice, you will become more familiar with the variations in your breasts and more comfortable with the exam. Contact a health care provider if you:  See a change in the shape or size of your breasts or nipples.  See a change in the skin of your breast or nipples, such as a reddened or scaly area.  Have unusual discharge  from your nipples.  Find a lump or thick area that was not there before.  Have pain in your breasts.  Have any concerns related to your breast health. Summary  Breast self-awareness includes looking for physical changes in your breasts, as well as feeling for any changes within your breasts.  Breast self-awareness should be performed in front of a mirror in a well-lit room.  You should examine your breasts every month. If you menstruate, the best time to examine your breasts is 5-7 days after your menstrual period.  Let your health care provider know of any changes you notice in your breasts, including changes in size, changes on the skin, pain or tenderness, or unusual fluid from your nipples. This information is not intended to replace advice given to you by your health care provider. Make sure you discuss any questions you have with your health care provider. Document Revised: 11/02/2017 Document Reviewed: 11/02/2017 Elsevier Patient Education  [place].

## 2019-06-08 NOTE — Progress Notes (Signed)
[facility] POST-OP OFFICE VISIT  06/08/2019  HPI: Tracy Madden is a 49 y.o. female 8 days s/p right breast RF ID localized lumpectomy.  Pathology confirmed all benign, no atypia, no malignancy.  She reports she is doing quite well having no pain, she presents today braless.  Vital signs: BP 104/71   Pulse 67   Temp (!) 96.6 F (35.9 C) (Temporal)   Ht 5\' 2"  (1.575 m)   Wt 162 lb (73.5 kg)   LMP  (LMP Unknown)   SpO2 97%   BMI 29.63 kg/m    Physical Exam: Constitutional: She appears well, nontoxic.  Skin: Right infra circumareolar scar appears intact, without any erythema.  There is no appreciable ecchymosis.  No evidence of induration.  Assessment/Plan: This is a 49 y.o. female 8 days s/p right breast lumpectomy for abnormal mammogram, complex sclerosing lesion.  All benign.  Patient Active Problem List   Diagnosis Date Noted  . 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 Uterine leiomyoma 03/16/2018  . Status post LAVH bilateral salpingectomy left oophorectomy 02/14/2018  . Status post LEEP (loop electrosurgical excision procedure) of cervix 03/31/2016  . 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 Endometriosis 10/17/2014  . Anemia, iron deficiency 04/05/2014  . FATIGUE 05/10/2007    -New right breast mammogram in 6 months to establish baseline.  Anticipating resumption of annual mammography.  Follow-up exam as needed.   Ronny Bacon M.D., FACS 06/08/2019, 11:03 AM

## 2019-10-18 ENCOUNTER — Encounter: Payer: Commercial Managed Care - PPO | Admitting: Obstetrics and Gynecology

## 2019-10-30 ENCOUNTER — Encounter: Payer: Self-pay | Admitting: Obstetrics and Gynecology

## 2020-03-07 ENCOUNTER — Telehealth: Payer: Self-pay

## 2020-03-07 NOTE — Telephone Encounter (Signed)
mychart message sent to patient

## 2020-04-02 IMAGING — US US BREAST*R* LIMITED INC AXILLA
1 series · 4 of 4 positions shown · non-contrast
Comparison: Previous exam(s).

CLINICAL DATA: Patient recalled from screening for right breast
distortion.

EXAM:
DIGITAL DIAGNOSTIC RIGHT MAMMOGRAM WITH CAD AND TOMO
ULTRASOUND RIGHT BREAST

[Series 1: us breast*right* limited inc axilla · 0.07mm/px · 4 of 4 slices shown]
[im 1/4]
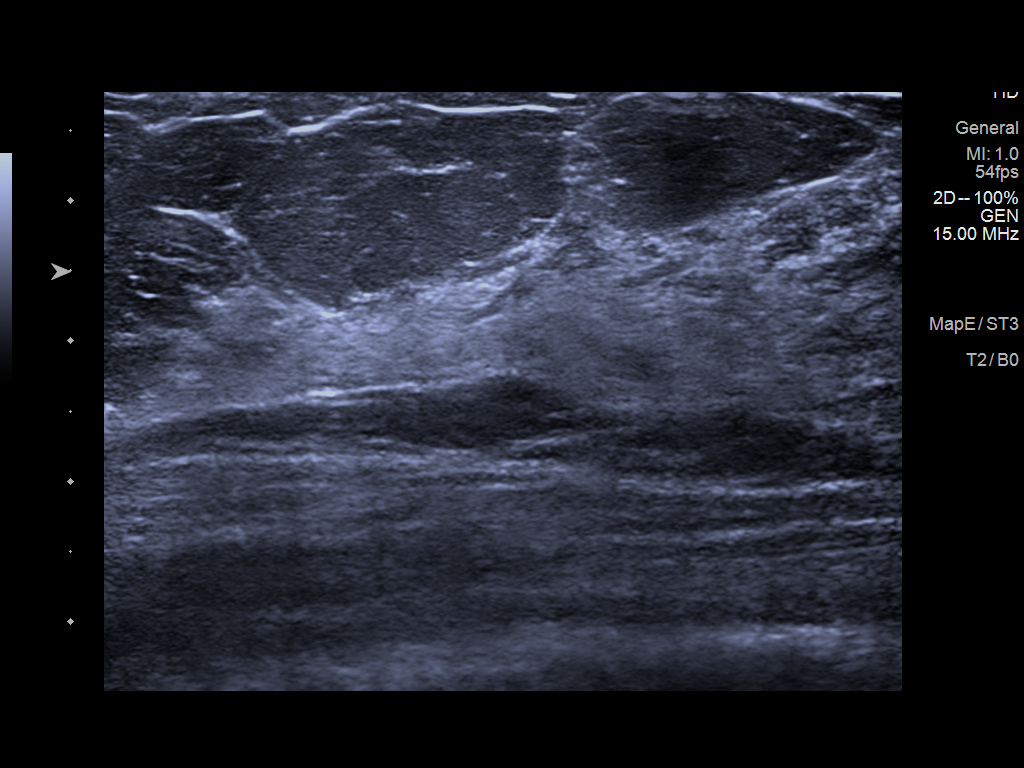
[im 2/4]
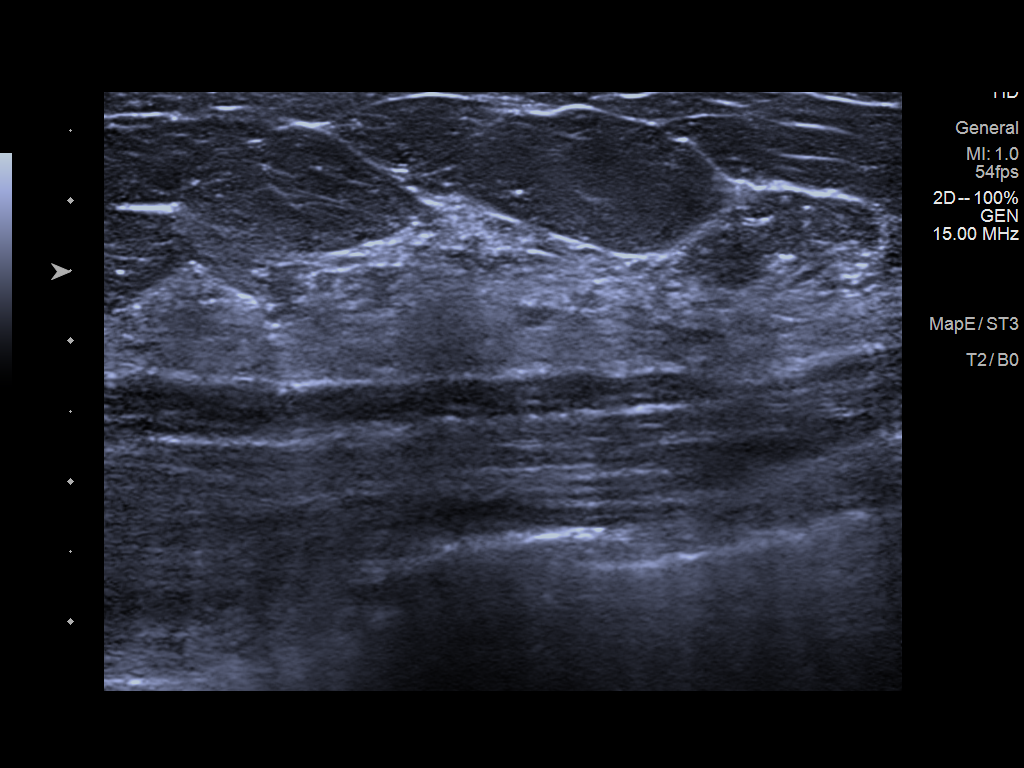
[im 3/4]
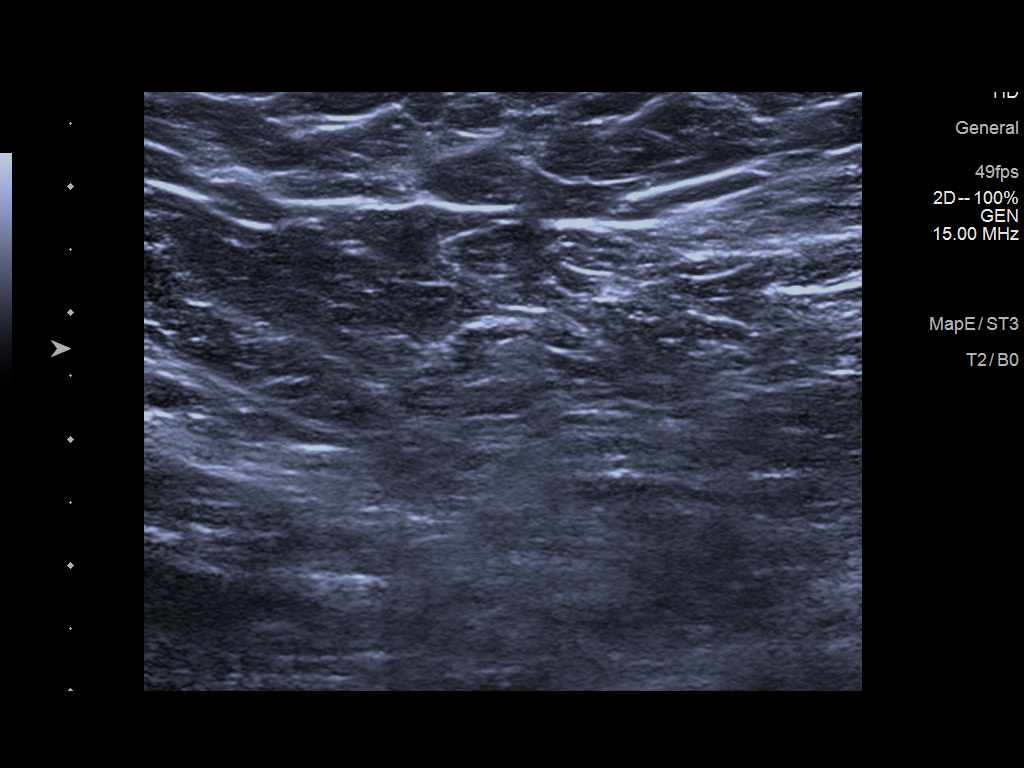
[im 4/4]
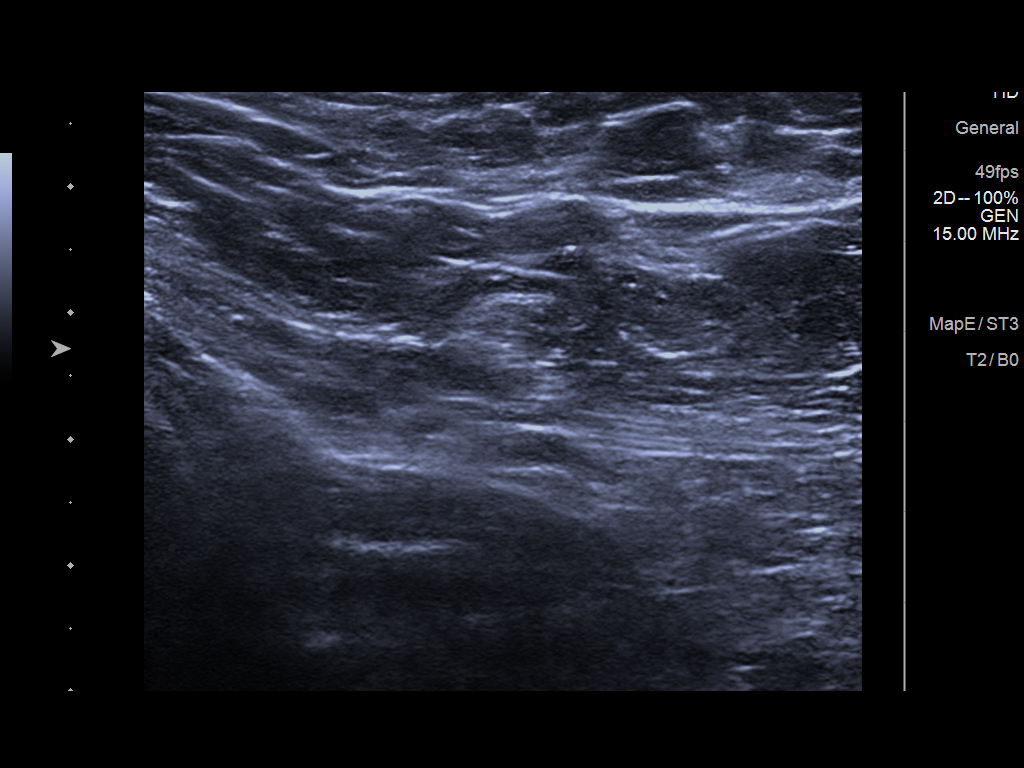

[4 of 4 positions shown; findings below may reference images not displayed]

ACR Breast Density Category c: The breast tissue is heterogeneously
dense, which may obscure small masses.
FINDINGS: Persistent focal distortion demonstrated within the lateral aspect
of the right breast middle depth. This distortion is predominately
demonstrated on the full paddle true lateral and spot compression
MLO tomosynthesis images. The distortion is not as well demonstrated
on the exaggerated CC lateral images.

Mammographic images were processed with CAD.

On physical exam, no discrete mass is palpated within the outer
right breast.

Targeted ultrasound is performed, showing normal tissue without
suspicious mass within the outer right breast.

No right axillary adenopathy.
IMPRESSION: Persistent focal distortion within the outer right breast.
Distortion is demonstrated best on the MLO and true lateral views.

RECOMMENDATION:
Stereotactic guided core needle biopsy persistent focal right breast
distortion outer right breast.

I have discussed the findings and recommendations with the patient.
If applicable, a reminder letter will be sent to the patient
regarding the next appointment.

BI-RADS CATEGORY  4: Suspicious.

## 2020-04-02 IMAGING — MG MM DIGITAL DIAGNOSTIC UNILAT*R* W/ TOMO W/ CAD
6 series · 6 of 18 positions shown · non-contrast
Comparison: Previous exam(s).

CLINICAL DATA: Patient recalled from screening for right breast
distortion.

EXAM:
DIGITAL DIAGNOSTIC RIGHT MAMMOGRAM WITH CAD AND TOMO
ULTRASOUND RIGHT BREAST

[R XCCL synth-2D]
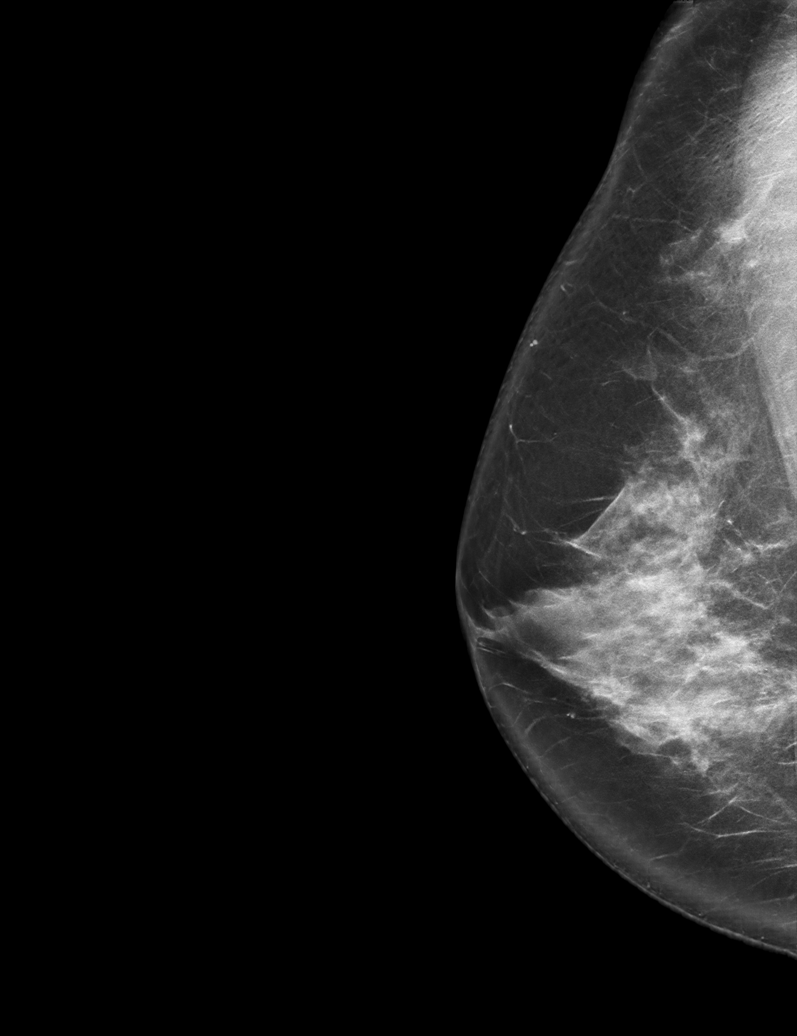

[R MLO synth-2D]
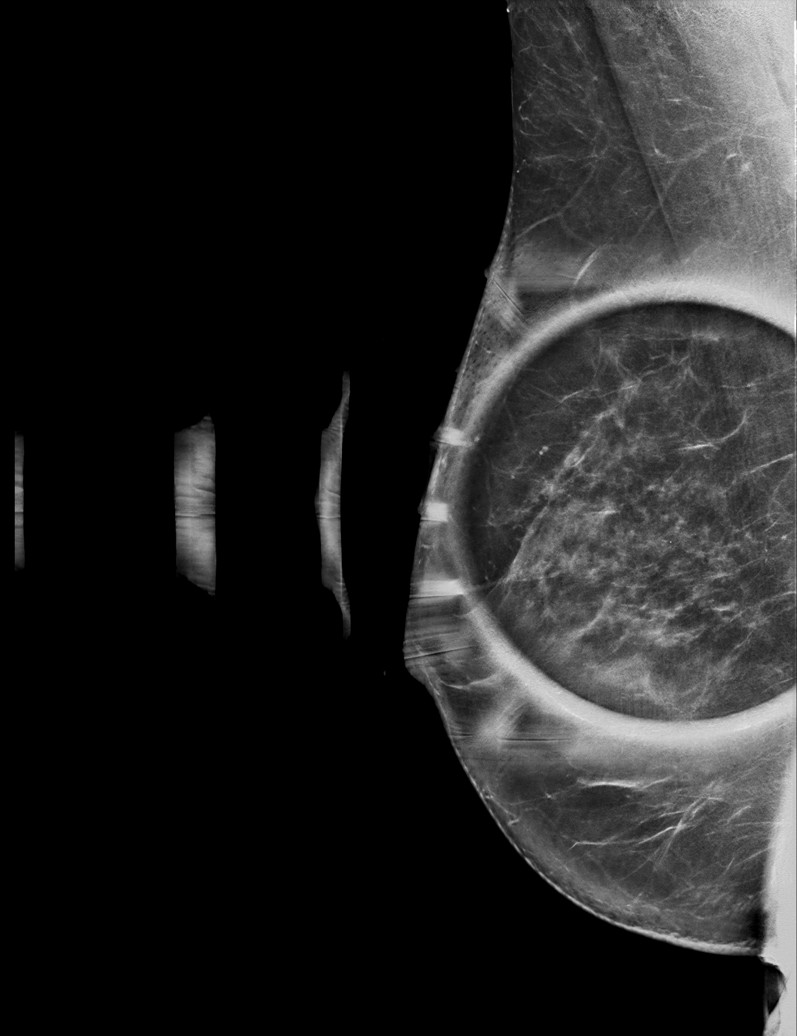

[R ML synth-2D]
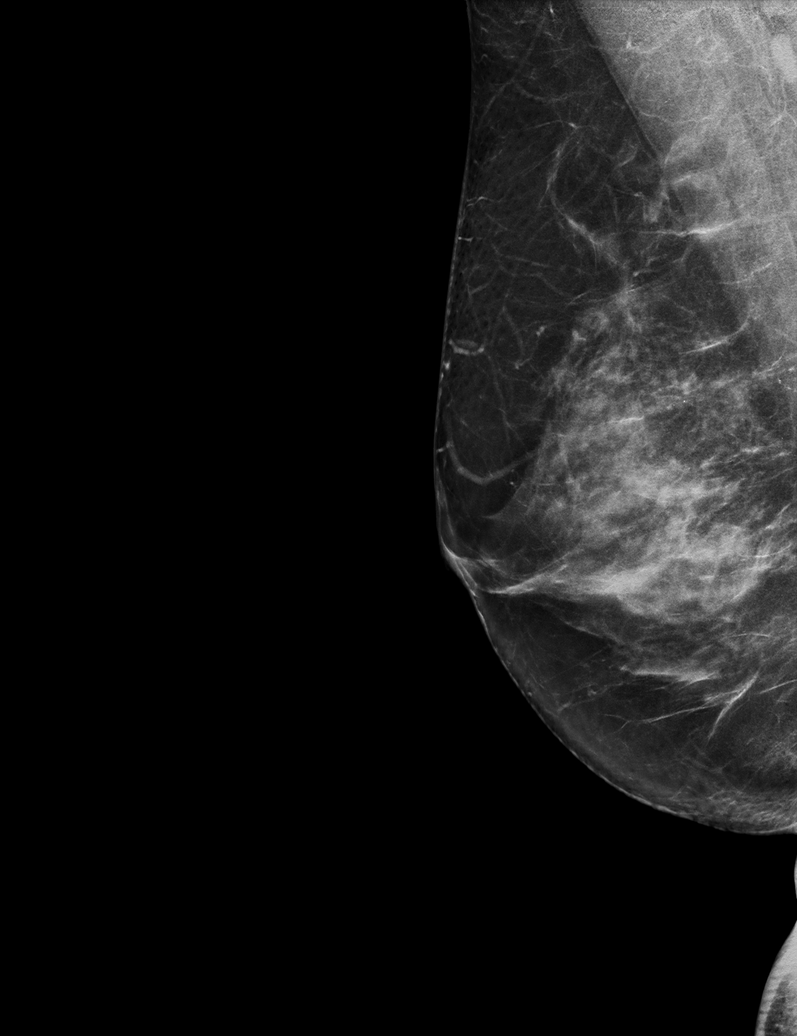

[R XCCL tomo · tomo slice 41/81.0]
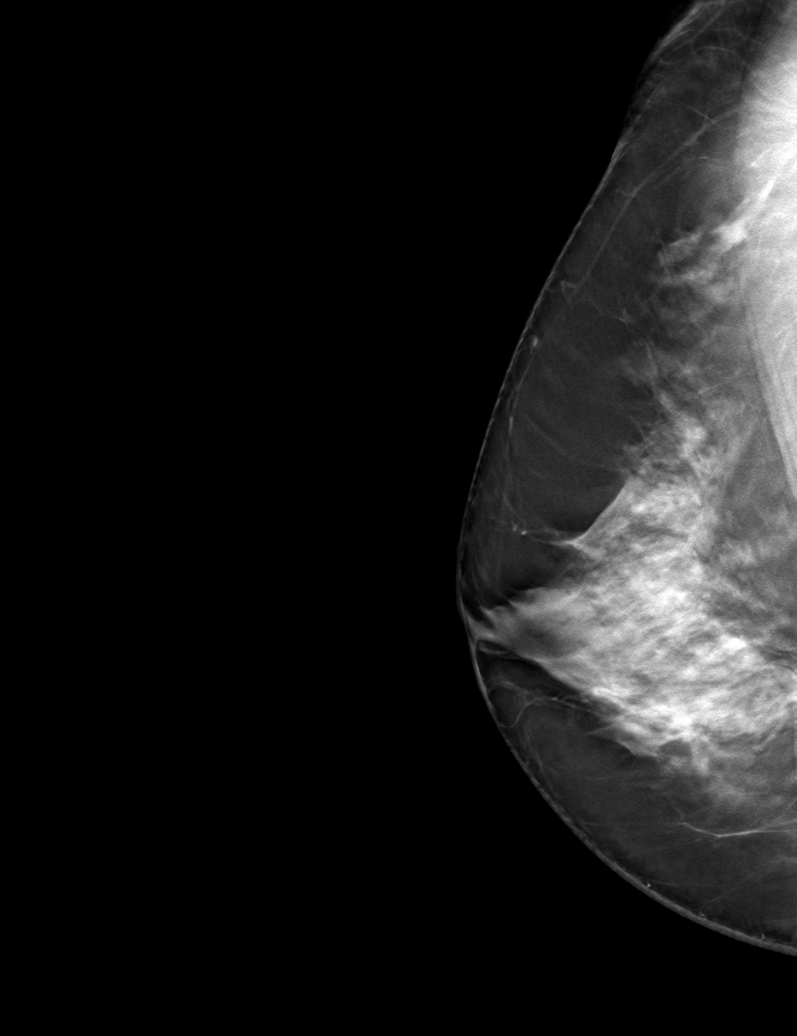

[R MLO tomo · tomo slice 39/78.0]
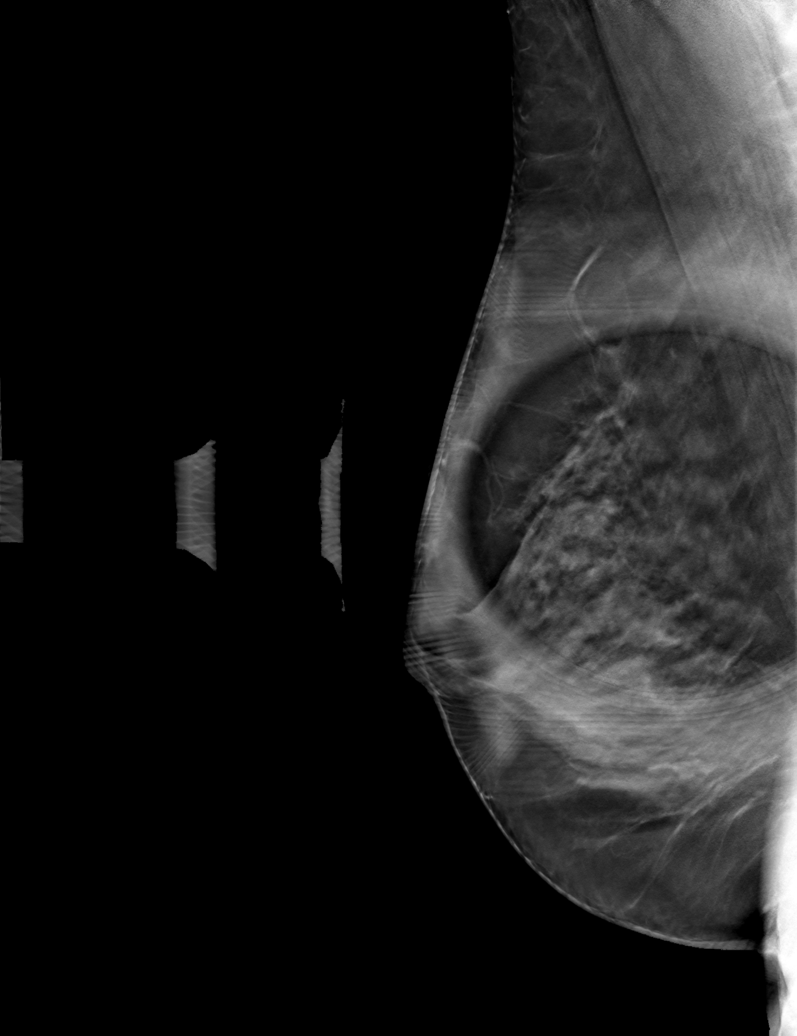

[R ML tomo · tomo slice 35/70.0]
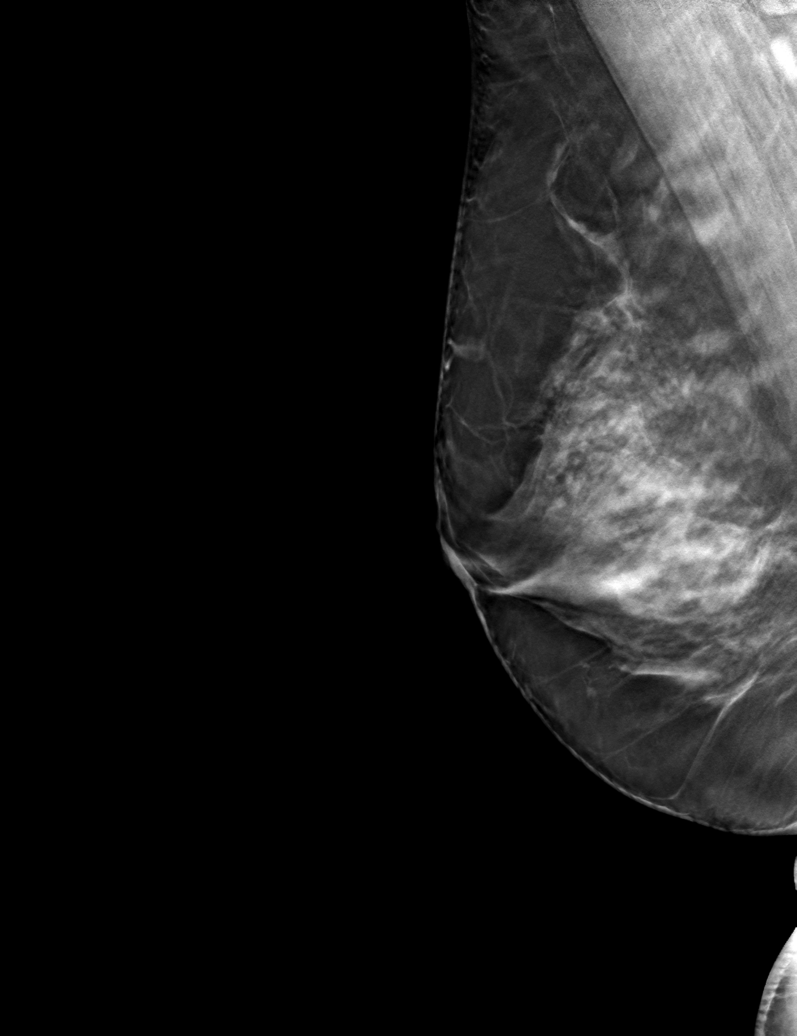

[6 of 18 positions shown; findings below may reference images not displayed]

ACR Breast Density Category c: The breast tissue is heterogeneously
dense, which may obscure small masses.
FINDINGS: Persistent focal distortion demonstrated within the lateral aspect
of the right breast middle depth. This distortion is predominately
demonstrated on the full paddle true lateral and spot compression
MLO tomosynthesis images. The distortion is not as well demonstrated
on the exaggerated CC lateral images.

Mammographic images were processed with CAD.

On physical exam, no discrete mass is palpated within the outer
right breast.

Targeted ultrasound is performed, showing normal tissue without
suspicious mass within the outer right breast.

No right axillary adenopathy.
IMPRESSION: Persistent focal distortion within the outer right breast.
Distortion is demonstrated best on the MLO and true lateral views.

RECOMMENDATION:
Stereotactic guided core needle biopsy persistent focal right breast
distortion outer right breast.

I have discussed the findings and recommendations with the patient.
If applicable, a reminder letter will be sent to the patient
regarding the next appointment.

BI-RADS CATEGORY  4: Suspicious.

## 2020-04-04 ENCOUNTER — Other Ambulatory Visit: Payer: Self-pay | Admitting: Family Medicine

## 2020-04-04 DIAGNOSIS — Z1231 Encounter for screening mammogram for malignant neoplasm of breast: Secondary | ICD-10-CM

## 2020-04-26 ENCOUNTER — Ambulatory Visit
Admission: RE | Admit: 2020-04-26 | Discharge: 2020-04-26 | Disposition: A | Discharge status: Home / Self Care | Payer: Commercial Managed Care - PPO | Source: Ambulatory Visit | Attending: Family Medicine | Admitting: Family Medicine

## 2020-04-26 ENCOUNTER — Other Ambulatory Visit: Payer: Self-pay

## 2020-04-26 DIAGNOSIS — Z1231 Encounter for screening mammogram for malignant neoplasm of breast: Secondary | ICD-10-CM | POA: Diagnosis present

## 2020-07-04 ENCOUNTER — Other Ambulatory Visit: Payer: Self-pay | Admitting: Family Medicine

## 2020-07-04 DIAGNOSIS — R8290 Unspecified abnormal findings in urine: Secondary | ICD-10-CM

## 2020-07-04 DIAGNOSIS — R109 Unspecified abdominal pain: Secondary | ICD-10-CM

## 2020-08-05 ENCOUNTER — Other Ambulatory Visit: Payer: Self-pay | Admitting: Family Medicine

## 2020-08-05 DIAGNOSIS — R109 Unspecified abdominal pain: Secondary | ICD-10-CM

## 2020-08-05 DIAGNOSIS — R300 Dysuria: Secondary | ICD-10-CM

## 2020-08-07 ENCOUNTER — Other Ambulatory Visit: Payer: Self-pay

## 2020-08-07 ENCOUNTER — Ambulatory Visit: Payer: Commercial Managed Care - PPO | Admitting: Obstetrics and Gynecology

## 2020-08-07 ENCOUNTER — Ambulatory Visit
Admission: RE | Admit: 2020-08-07 | Discharge: 2020-08-07 | Disposition: A | Discharge status: Home / Self Care | Payer: Commercial Managed Care - PPO | Source: Ambulatory Visit | Attending: Family Medicine | Admitting: Family Medicine

## 2020-08-07 ENCOUNTER — Encounter: Payer: Self-pay | Admitting: Obstetrics and Gynecology

## 2020-08-07 VITALS — BP 111/70 | HR 84 | Ht 62.0 in | Wt 168.1 lb

## 2020-08-07 DIAGNOSIS — R109 Unspecified abdominal pain: Secondary | ICD-10-CM | POA: Diagnosis present

## 2020-08-07 DIAGNOSIS — N760 Acute vaginitis: Secondary | ICD-10-CM | POA: Diagnosis not present

## 2020-08-07 DIAGNOSIS — R300 Dysuria: Secondary | ICD-10-CM | POA: Diagnosis present

## 2020-08-07 DIAGNOSIS — B9689 Other specified bacterial agents as the cause of diseases classified elsewhere: Secondary | ICD-10-CM

## 2020-08-07 MED ORDER — METRONIDAZOLE 0.75 % VA GEL: 1.0000 | Freq: Every day | VAGINAL | 1 refills | Status: DC

## 2020-08-07 NOTE — Progress Notes (Signed)
Pt present due to having vaginal discharge. Pt reported noticing symptoms about a month ago; watery discharge, increase in discharge, no odor and lower abd pain.

## 2020-08-07 NOTE — Patient Instructions (Signed)
Bacterial Vaginosis  Bacterial vaginosis is an infection that occurs when the normal balance of bacteria in the vagina changes. This change is caused by an overgrowth of certain bacteria in the vagina. Bacterial vaginosis is the most common vaginal infection among females aged 49 to 101 years. This condition increases the risk of sexually transmitted infections (STIs). Treatment can help reduce this risk. Treatment is very important for pregnant women because this condition can cause babies to be born early (prematurely) or at a low birth weight. What are the causes? This condition is caused by an increase in harmful bacteria that are normally present in small amounts in the vagina. However, the exact reason this condition develops is not known. You cannot get bacterial vaginosis from toilet seats, bedding, swimming pools, or contact with objects around you. What increases the risk? The following factors may make you more likely to develop this condition:  Having a new sexual partner or multiple sexual partners, or having unprotected sex.  Douching.  Having an intrauterine device (IUD).  Smoking.  Abusing drugs and alcohol. This may lead to riskier sexual behavior.  Taking certain antibiotic medicines.  Being pregnant. What are the signs or symptoms? Some women with this condition have no symptoms. Symptoms may include:  Pearline Cables or white vaginal discharge. The discharge can be watery or foamy.  A fish-like odor with discharge, especially after sex or during menstruation.  Itching in and around the vagina.  Burning or pain with urination. How is this diagnosed? This condition is diagnosed based on:  Your medical history.  A physical exam of the vagina.  Checking a sample of vaginal fluid for harmful bacteria or abnormal cells. How is this treated? This condition is treated with antibiotic medicines. These may be given as a pill, a vaginal cream, or a medicine that is put into the  vagina (suppository). If the condition comes back after treatment, a second round of antibiotics may be needed. Follow these instructions at home: Medicines  Take or apply over-the-counter and prescription medicines only as told by your health care provider.  Take or apply your antibiotic medicine as told by your health care provider. Do not stop using the antibiotic even if you start to feel better. General instructions  If you have a female sexual partner, tell her that you have a vaginal infection. She should follow up with her health care provider. If you have a female sexual partner, he does not need treatment.  Avoid sexual activity until you finish treatment.  Drink enough fluid to keep your urine pale yellow.  Keep the area around your vagina and rectum clean. ? Wash the area daily with warm water. ? Wipe yourself from front to back after using the toilet.  If you are breastfeeding, talk to your health care provider about continuing breastfeeding during treatment.  Keep all follow-up visits. This is important. How is this prevented? Self-care  Do not douche.  Wash the outside of your vagina with warm water only.  Wear cotton or cotton-lined underwear.  Avoid wearing tight pants and pantyhose, especially during the summer. Safe sex  Use protection when having sex. This includes: ? Using condoms. ? Using dental dams. This is a thin layer of a material made of latex or polyurethane that protects the mouth during oral sex.  Limit the number of sexual partners. To help prevent bacterial vaginosis, it is best to have sex with just one partner (monogamous relationship).  Make sure you and your sexual partner  are tested for STIs. Drugs and alcohol  Do not use any products that contain nicotine or tobacco. These products include cigarettes, chewing tobacco, and vaping devices, such as e-cigarettes. If you need help quitting, ask your health care provider.  Do not use  drugs.  Do not drink alcohol if: ? Your health care provider tells you not to do this. ? You are pregnant, may be pregnant, or are planning to become pregnant.  If you drink alcohol: ? Limit how much you have to 0-1 drink a day. ? Be aware of how much alcohol is in your drink. In the [place] one drink equals one 12 oz bottle of beer (355 mL), one 5 oz glass of wine (148 mL), or one 1 oz glass of hard liquor (44 mL). Where to find more information  Centers for Disease Control and Prevention: http://www.wolf.info/  American Sexual Health Association (ASHA): www.ashastd.org  U.S. Department of Health and Financial controller, Office on Women's Health: [place].tn Contact a health care provider if:  Your symptoms do not improve, even after treatment.  You have more discharge or pain when urinating.  You have a fever or chills.  You have pain in your abdomen or pelvis.  You have pain during sex.  You have vaginal bleeding between menstrual periods. Summary  Bacterial vaginosis is a vaginal infection that occurs when the normal balance of bacteria in the vagina changes. It results from an overgrowth of certain bacteria.  This condition increases the risk of sexually transmitted infections (STIs). Getting treated can help reduce this risk.  Treatment is very important for pregnant women because this condition can cause babies to be born early (prematurely) or at low birth weight.  This condition is treated with antibiotic medicines. These may be given as a pill, a vaginal cream, or a medicine that is put into the vagina (suppository). This information is not intended to replace advice given to you by your health care provider. Make sure you discuss any questions you have with your health care provider. Document Revised: 09/14/2019 Document Reviewed: 09/14/2019 Elsevier Patient Education  [place].

## 2020-08-07 NOTE — Progress Notes (Signed)
    GYNECOLOGY PROGRESS NOTE  Subjective:    Patient ID: Tracy Madden, female    DOB: March 18, 1971, 50 y.o.   MRN: [id number]  HPI  Patient is a 50 y.o. G62P1001 female who presents for complaints of watery/clear discharge.  Reports a recent trip to the beach, and after this, began to notice the change.  Has been going on for several weeks. Now noting some pelvis feels "uncomfortable", has a nagging dull feeling. Denies risk of STI exposure, is married and works long hours.   The following portions of the patient's history were reviewed and updated as appropriate: allergies, current medications, past family history, past medical history, past social history, past surgical history and problem list.   Review of Systems Pertinent items noted in HPI and remainder of comprehensive ROS otherwise negative.   Objective:   Blood pressure 111/70, pulse 84, height 5\' 2"  (1.575 m), weight 168 lb 1.6 oz (76.2 kg). General appearance: alert and no distress Abdomen: soft, non-tender; bowel sounds normal; no masses,  no organomegaly Pelvic: external genitalia normal, rectovaginal septum normal.  Vagina with scant thin white discharge. Vaginal cuff tender to touch from Q-tip.  Uterus and cervix surgically absent.    Labs: Microscopic wet-mount exam shows moderate clue cells, no hyphae, no trichomonads, few white blood cells. KOH done.     Assessment:   Bacterial vaginosis  Plan:   1. Bacterial vaginosis - discussed treatment options, desires Metrogel prescription. Will prescribe.  2. Return if symptoms worsen or fail to improve.   Rubie Maid, MD [facility]

## 2020-08-18 ENCOUNTER — Other Ambulatory Visit: Payer: Self-pay | Admitting: Internal Medicine

## 2020-08-18 MED ORDER — NIRMATRELVIR/RITONAVIR (PAXLOVID)TABLET: 3.0000 | ORAL_TABLET | Freq: Two times a day (BID) | ORAL | 0 refills | Status: AC

## 2020-11-04 ENCOUNTER — Ambulatory Visit: Payer: Commercial Managed Care - PPO | Admitting: Certified Nurse Midwife

## 2020-11-04 ENCOUNTER — Other Ambulatory Visit: Payer: Self-pay

## 2020-11-04 ENCOUNTER — Encounter: Payer: Self-pay | Admitting: Certified Nurse Midwife

## 2020-11-04 VITALS — BP 128/80 | HR 79 | Ht 62.0 in | Wt 163.0 lb

## 2020-11-04 DIAGNOSIS — L739 Follicular disorder, unspecified: Secondary | ICD-10-CM

## 2020-11-04 NOTE — Progress Notes (Signed)
GYN ENCOUNTER NOTE  Subjective:       Tracy Madden is a 50 y.o. G80P1001 female is here for gynecologic evaluation of the following issues:  1. Pt state she has a bump on her right labia and is concerned. State she had Dr. 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 No pathology report seen in her chart for this. She denies having had any other treatments to the area.     Gynecologic History No LMP recorded (lmp unknown). Patient has had a hysterectomy. Contraception: status post hysterectomy  Last mammogram: 04/26/2020. Results were: normal  Obstetric History OB History  Gravida Para Term Preterm AB Living  '1 1 1     1  '$ SAB IAB Ectopic Multiple Live Births          1    # Outcome Date GA Lbr Len/2nd Weight Sex Delivery Anes PTL Lv  1 Term 26    M Vag-Spont   LIV    Past Medical History:  Diagnosis Date   Abdominal pain, LLQ    Abnormal uterine bleeding    Anemia    Cervical dysplasia    Endometriosis    Fibroid    Melanoma ([place])    RT SHOULDER   Paratubal cyst    Pneumonia    Prediabetes     Past Surgical History:  Procedure Laterality Date   BREAST BIOPSY Right 03/22/2019   Affirm Bx-"X" clip-path pending   BREAST LUMPECTOMY WITH NEEDLE LOCALIZATION Right 05/31/2019   Procedure: Right BREAST LUMPECTOMY WITH NEEDLE LOCALIZATION;  Surgeon: Ronny Bacon, MD;  Location: [facility];  Service: General;  Laterality: Right;   CYSTOSCOPY Bilateral 02/14/2018   Procedure: CYSTOSCOPY;  Surgeon: Defrancesco, Alanda Slim, MD;  Location: [facility];  Service: Gynecology;  Laterality: Bilateral;   DILATION AND CURETTAGE OF UTERUS     HYSTEROSCOPY     LAPAROSCOPIC ASSISTED VAGINAL HYSTERECTOMY N/A 02/14/2018   Procedure: LAPAROSCOPIC ASSISTED VAGINAL HYSTERECTOMY;  Surgeon: [name], MD;  Location: [facility];  Service: Gynecology;  Laterality: N/A;   LAPAROSCOPIC BILATERAL SALPINGECTOMY Bilateral 02/14/2018   Procedure: LAPAROSCOPIC BILATERAL  SALPINGECTOMY;  Surgeon: [name], MD;  Location: [facility];  Service: Gynecology;  Laterality: Bilateral;   LAPAROSCOPY     DRAINAGE AND EXCISION OF PARATUBAL CYST   LEEP  2002   LEEP N/A 03/16/2016   Procedure: LOOP ELECTROSURGICAL EXCISION PROCEDURE (LEEP);  Surgeon: [name], MD;  Location: [facility];  Service: Gynecology;  Laterality: N/A;   NOVASURE ABLATION  2016   TONSILLECTOMY      Current Outpatient Medications on File Prior to Visit  Medication Sig Dispense Refill   Ascorbic Acid (VITAMIN C PO) Take by mouth. As needed (Patient not taking: Reported on 11/04/2020)     MAGNESIUM PO Take by mouth. As needed (Patient not taking: Reported on 11/04/2020)     metroNIDAZOLE (METROGEL) 0.75 % vaginal gel Place 1 Applicatorful vaginally at bedtime. Apply one applicatorful to vagina at bedtime for 5 days (Patient not taking: Reported on 11/04/2020) 70 g 1   Multiple Vitamins-Minerals (ZINC PO) Take by mouth. As needed (Patient not taking: Reported on 11/04/2020)     OVER THE COUNTER MEDICATION Cocolbstrul  As needed (Patient not taking: Reported on 11/04/2020)     VITAMIN D PO Take by mouth. As needed (Patient not taking: Reported on 11/04/2020)     No current facility-administered medications on file prior to visit.    Allergies  Allergen  Reactions   Aspirin Shortness Of Breath and Other (See Comments)    Inhale aspirin, no reaction to oral aspirin    Eggs Or Egg-Derived Products Anaphylaxis   Niacin Hives, Rash and Other (See Comments)    Whelps in a matter of seconds    Social History   Socioeconomic History   Marital status: Married    Spouse name: Not on file   Number of children: Not on file   Years of education: Not on file   Highest education level: Not on file  Occupational History   Not on file  Tobacco Use   Smoking status: Never   Smokeless tobacco: Never  Vaping Use   Vaping Use: Never used  Substance and Sexual Activity   Alcohol use: Yes     Comment: OCCAS   Drug use: No   Sexual activity: Not Currently    Birth control/protection: Surgical  Other Topics Concern   Not on file  Social History Narrative   Not on file   Social Determinants of Health   Financial Resource Strain: Not on file  Food Insecurity: Not on file  Transportation Needs: Not on file  Physical Activity: Not on file  Stress: Not on file  Social Connections: Not on file  Intimate Partner Violence: Not on file    Family History  Problem Relation Age of Onset   Uterine cancer Mother     The following portions of the patient's history were reviewed and updated as appropriate: allergies, current medications, past family history, past medical history, past social history, past surgical history and problem list.  Review of Systems Review of Systems - Negative except as mentioned in HPI  Review of Systems - General ROS: negative for - chills, fatigue, fever, hot flashes, malaise or night sweats Hematological and Lymphatic ROS: negative for - bleeding problems or swollen lymph nodes Gastrointestinal ROS: negative for - abdominal pain, blood in stools, change in bowel habits and nausea/vomiting Musculoskeletal ROS: negative for - joint pain, muscle pain or muscular weakness Genito-Urinary ROS: negative for - change in menstrual cycle, dysmenorrhea, dyspareunia, dysuria, genital discharge, genital ulcers, hematuria, incontinence, irregular/heavy menses, nocturia or pelvic pain. Positive for bump righ labia  Objective:   BP 128/80   Pulse 79   Ht '5\' 2"'$  (1.575 m)   Wt 163 lb (73.9 kg)   LMP  (LMP Unknown)   BMI 29.81 kg/m  CONSTITUTIONAL: Well-developed, well-nourished female in no acute distress.  HENT:  Normocephalic, atraumatic.  NECK: Normal range of motion, supple, no masses.  Normal thyroid.  SKIN: Skin is warm and dry. No rash noted. Not diaphoretic. No erythema. No pallor. [place]: Alert and oriented to person, place, and time. PSYCHIATRIC:  Normal mood and affect. Normal behavior. Normal judgment and thought content. CARDIOVASCULAR:Not Examined RESPIRATORY: Not Examined BREASTS: Not Examined ABDOMEN: Soft, non distended; Non tender.  No Organomegaly. PELVIC:  External Genitalia: Normal, no changes in skin , no redness or tenderness. Nothing abnormal noted, pt felt with her own finger several times. Pointed to a raised hair follicle.   mal range of motion. No tenderness.  No cyanosis, clubbing, or edema.     Assessment:   Inflamed hair follicle.   Plan:   Reassurance given. Discussed avoidance of shaving, Warm compress or warm bath prn. Follow up prn with Dr. Marcelline Mates.   Philip Aspen, CNM

## 2020-12-05 ENCOUNTER — Other Ambulatory Visit: Payer: Self-pay | Admitting: Internal Medicine

## 2020-12-05 ENCOUNTER — Ambulatory Visit (INDEPENDENT_AMBULATORY_CARE_PROVIDER_SITE_OTHER): Payer: Commercial Managed Care - PPO | Admitting: Internal Medicine

## 2020-12-05 ENCOUNTER — Encounter: Payer: Self-pay | Admitting: Internal Medicine

## 2020-12-05 ENCOUNTER — Other Ambulatory Visit: Payer: Self-pay

## 2020-12-05 VITALS — BP 105/68 | HR 72 | Temp 97.9°F | Resp 17 | Ht 62.0 in | Wt 164.4 lb

## 2020-12-05 DIAGNOSIS — E66811 Obesity, class 1: Secondary | ICD-10-CM | POA: Insufficient documentation

## 2020-12-05 DIAGNOSIS — Z0001 Encounter for general adult medical examination with abnormal findings: Secondary | ICD-10-CM

## 2020-12-05 DIAGNOSIS — E6609 Other obesity due to excess calories: Secondary | ICD-10-CM | POA: Insufficient documentation

## 2020-12-05 DIAGNOSIS — Z6830 Body mass index (BMI) 30.0-30.9, adult: Secondary | ICD-10-CM | POA: Diagnosis not present

## 2020-12-05 MED ORDER — SAXENDA 18 MG/3ML ~~LOC~~ SOPN: PEN_INJECTOR | SUBCUTANEOUS | 0 refills | Status: DC

## 2020-12-05 MED ORDER — INSULIN PEN NEEDLE 31G X 5 MM MISC: 3 refills | Status: DC

## 2020-12-05 NOTE — Progress Notes (Signed)
Subjective:    Patient ID: Tracy Madden, female    DOB: Jun 24, 1970, 50 y.o.   MRN: [id number]  HPI  Patient presents the clinic today for her annual exam.  Flu: 01/2020 Tetanus: > 10 years ago COVID: Moderna x 3 Pap smear: 07/2017, hysterectomy Mammogram: 03/2020 Colon screening: 06/2015 Vision screening: annually Dentist: biannually  Diet: She does eat meats. She consumes fruits and veggies. She does eat fried foods. She drinks mostly water. Exercise: Walking  Review of Systems  Past Medical History:  Diagnosis Date   Abdominal pain, LLQ    Abnormal uterine bleeding    Anemia    Cervical dysplasia    Endometriosis    Fibroid    Melanoma ([place])    RT SHOULDER   Paratubal cyst    Pneumonia    Prediabetes     Current Outpatient Medications  Medication Sig Dispense Refill   Ascorbic Acid (VITAMIN C PO) Take by mouth. As needed (Patient not taking: Reported on 11/04/2020)     MAGNESIUM PO Take by mouth. As needed (Patient not taking: Reported on 11/04/2020)     metroNIDAZOLE (METROGEL) 0.75 % vaginal gel Place 1 Applicatorful vaginally at bedtime. Apply one applicatorful to vagina at bedtime for 5 days (Patient not taking: Reported on 11/04/2020) 70 g 1   Multiple Vitamins-Minerals (ZINC PO) Take by mouth. As needed (Patient not taking: Reported on 11/04/2020)     OVER THE COUNTER MEDICATION Cocolbstrul  As needed (Patient not taking: Reported on 11/04/2020)     VITAMIN D PO Take by mouth. As needed (Patient not taking: Reported on 11/04/2020)     No current facility-administered medications for this visit.    Allergies  Allergen Reactions   Aspirin Shortness Of Breath and Other (See Comments)    Inhale aspirin, no reaction to oral aspirin    Eggs Or Egg-Derived Products Anaphylaxis   Niacin Hives, Rash and Other (See Comments)    Whelps in a matter of seconds    Family History  Problem Relation Age of Onset   Uterine cancer Mother     Social History    Socioeconomic History   Marital status: Married    Spouse name: Not on file   Number of children: Not on file   Years of education: Not on file   Highest education level: Not on file  Occupational History   Not on file  Tobacco Use   Smoking status: Never   Smokeless tobacco: Never  Vaping Use   Vaping Use: Never used  Substance and Sexual Activity   Alcohol use: Yes    Comment: OCCAS   Drug use: No   Sexual activity: Not Currently    Birth control/protection: Surgical  Other Topics Concern   Not on file  Social History Narrative   Not on file   Social Determinants of Health   Financial Resource Strain: Not on file  Food Insecurity: Not on file  Transportation Needs: Not on file  Physical Activity: Not on file  Stress: Not on file  Social Connections: Not on file  Intimate Partner Violence: Not on file     Constitutional: Patient reports difficulty losing weight.  Denies fever, malaise, fatigue, headache or abrupt weight changes.  HEENT: Denies eye pain, eye redness, ear pain, ringing in the ears, wax buildup, runny nose, nasal congestion, bloody nose, or sore throat. Respiratory: Denies difficulty breathing, shortness of breath, cough or sputum production.   Cardiovascular: Denies chest pain, chest tightness, palpitations  or swelling in the hands or feet.  Gastrointestinal: Patient reports intermittent constipation.  Denies abdominal pain, bloating, diarrhea or blood in the stool.  GU: Denies urgency, frequency, pain with urination, burning sensation, blood in urine, odor or discharge. Musculoskeletal: Denies decrease in range of motion, difficulty with gait, muscle pain or joint pain and swelling.  Skin: Denies redness, rashes, lesions or ulcercations.  Neurological: Denies dizziness, difficulty with memory, difficulty with speech or problems with balance and coordination.  Psych: Patient reports mild depression.  Denies anxiety, SI/HI.  No other specific  complaints in a complete review of systems (except as listed in HPI above).     Objective:   Physical Exam   Blood pressure 105/68, pulse 72, temperature 97.9 F (36.6 C), temperature source Temporal, resp. rate 17, height '5\' 2"'$  (1.575 m), weight 164 lb 6.4 oz (74.6 kg), SpO2 100 %.  Wt Readings from Last 3 Encounters:  11/04/20 163 lb (73.9 kg)  08/07/20 168 lb 1.6 oz (76.2 kg)  06/08/19 162 lb (73.5 kg)    General: Appears her stated age, obese, in NAD. Skin: Warm, dry and intact.  HEENT: Head: normal shape and size; Eyes: sclera white and EOMs intact;  Neck:  Neck supple, trachea midline. No masses, lumps or thyromegaly present.  Cardiovascular: Normal rate and rhythm. S1,S2 noted.  No murmur, rubs or gallops noted. No JVD or BLE edema. No carotid bruits noted. Pulmonary/Chest: Normal effort and positive vesicular breath sounds. No respiratory distress. No wheezes, rales or ronchi noted.  Musculoskeletal: Strength 5/5 BUE/BLE.  No difficulty with gait.  Neurological: Alert and oriented. Cranial nerves II-XII grossly intact. Coordination normal.  Psychiatric: Mood and affect normal. Behavior is normal. Judgment and thought content normal.    BMET    Component Value Date/Time   NA 143 10/12/2018 1519   K 4.2 10/12/2018 1519   CL 102 10/12/2018 1519   CO2 23 10/12/2018 1519   GLUCOSE 91 10/12/2018 1519   BUN 13 10/12/2018 1519   CREATININE 0.82 10/12/2018 1519   CALCIUM 9.8 10/12/2018 1519   GFRNONAA 85 10/12/2018 1519   GFRAA 98 10/12/2018 1519    Lipid Panel     Component Value Date/Time   CHOL 182 10/12/2018 1519   TRIG 232 (H) 10/12/2018 1519   HDL 41 10/12/2018 1519   CHOLHDL 4.4 10/12/2018 1519   LDLCALC 95 10/12/2018 1519    CBC    Component Value Date/Time   WBC 6.3 10/12/2018 1519   WBC 15.1 (H) 02/14/2018 1209   RBC 4.64 10/12/2018 1519   RBC 4.50 02/14/2018 1209   HGB 13.4 10/12/2018 1519   HCT 39.6 10/12/2018 1519   PLT 315 10/12/2018 1519    MCV 85 10/12/2018 1519   MCH 28.9 10/12/2018 1519   MCH 28.9 02/14/2018 1209   MCHC 33.8 10/12/2018 1519   MCHC 31.9 02/14/2018 1209   RDW 12.7 10/12/2018 1519   LYMPHSABS 1.8 03/12/2016 1041   MONOABS 0.4 03/12/2016 1041   EOSABS 0.1 03/12/2016 1041   BASOSABS 0.0 03/12/2016 1041    Hgb A1C Lab Results  Component Value Date   HGBA1C 5.8 (H) 10/12/2018           Assessment & Plan:   Preventative Health Maintenance:  Encouraged her to get a flu shot in the fall She declines tetanus booster today and Encouraged her to get her COVID booster Pap smear UTD Mammogram UTD Colon screening UTD Encouraged her to consume about statin exercise regimen Advised  her to see an eye doctor and dentist annually Labs reviewed  Return precautions discussed Webb Silversmith, NP This visit occurred during the SARS-CoV-2 public health emergency.  Safety protocols were in place, including screening questions prior to the visit, additional usage of staff PPE, and extensive cleaning of exam room while observing appropriate contact time as indicated for disinfecting solutions.

## 2020-12-05 NOTE — Telephone Encounter (Signed)
Requested medication (s) are due for refill today - no  Requested medication (s) are on the active medication list -yes  Future visit scheduled -no  Last refill: today  Notes to clinic: Pharmacy request : change Rx- not covered by insurance- sent for review   Requested Prescriptions  Pending Prescriptions Disp Refills   SAXENDA 18 MG/3ML SOPN [Pharmacy Med Name: SAXENDA 18 MG/3 ML PEN]  0    Sig: Injection 0.6 mg into skin once daily for 1 week, as tolerated increase by increment of 0.'6mg'$  every 1 week, max dose is '3mg'$  injection daily after 5 weeks.     Endocrinology:  Diabetes - GLP-1 Receptor Agonists Failed - 12/05/2020 11:22 AM      Failed - HBA1C is between 0 and 7.9 and within 180 days    Hgb A1c MFr Bld  Date Value Ref Range Status  10/12/2018 5.8 (H) 4.8 - 5.6 % Final    Comment:             Prediabetes: 5.7 - 6.4          Diabetes: >6.4          Glycemic control for adults with diabetes: <7.0           Passed - Valid encounter within last 6 months    Recent Outpatient Visits           Today Encounter for annual general medical examination with abnormal findings in adult   [place], NP                 Requested Prescriptions  Pending Prescriptions Disp Refills   [place] 18 MG/3ML SOPN [Pharmacy Med Name: SAXENDA 18 MG/3 ML PEN]  0    Sig: Injection 0.6 mg into skin once daily for 1 week, as tolerated increase by increment of 0.'6mg'$  every 1 week, max dose is '3mg'$  injection daily after 5 weeks.     Endocrinology:  Diabetes - GLP-1 Receptor Agonists Failed - 12/05/2020 11:22 AM      Failed - HBA1C is between 0 and 7.9 and within 180 days    Hgb A1c MFr Bld  Date Value Ref Range Status  10/12/2018 5.8 (H) 4.8 - 5.6 % Final    Comment:             Prediabetes: 5.7 - 6.4          Diabetes: >6.4          Glycemic control for adults with diabetes: <7.0           Passed - Valid encounter within last 6 months    Recent  Outpatient Visits           Today Encounter for annual general medical examination with abnormal findings in adult   [facility] [place], Coralie Keens, [place]

## 2020-12-05 NOTE — Patient Instructions (Signed)
Health Maintenance, Female Adopting a healthy lifestyle and getting preventive care are important in promoting health and wellness. Ask your health care provider about: The right schedule for you to have regular tests and exams. Things you can do on your own to prevent diseases and keep yourself healthy. What should I know about diet, weight, and exercise? Eat a healthy diet  Eat a diet that includes plenty of vegetables, fruits, low-fat dairy products, and lean protein. Do not eat a lot of foods that are high in solid fats, added sugars, or sodium. Maintain a healthy weight Body mass index (BMI) is used to identify weight problems. It estimates body fat based on height and weight. Your health care provider can help determine your BMI and help you achieve or maintain a healthy weight. Get regular exercise Get regular exercise. This is one of the most important things you can do for your health. Most adults should: Exercise for at least 150 minutes each week. The exercise should increase your heart rate and make you sweat (moderate-intensity exercise). Do strengthening exercises at least twice a week. This is in addition to the moderate-intensity exercise. Spend less time sitting. Even light physical activity can be beneficial. Watch cholesterol and blood lipids Have your blood tested for lipids and cholesterol at 50 years of age, then have this test every 5 years. Have your cholesterol levels checked more often if: Your lipid or cholesterol levels are high. You are older than 50 years of age. You are at high risk for heart disease. What should I know about cancer screening? Depending on your health history and family history, you may need to have cancer screening at various ages. This may include screening for: Breast cancer. Cervical cancer. Colorectal cancer. Skin cancer. Lung cancer. What should I know about heart disease, diabetes, and high blood pressure? Blood pressure and heart  disease High blood pressure causes heart disease and increases the risk of stroke. This is more likely to develop in people who have high blood pressure readings, are of African descent, or are overweight. Have your blood pressure checked: Every 3-5 years if you are 18-39 years of age. Every year if you are 40 years old or older. Diabetes Have regular diabetes screenings. This checks your fasting blood sugar level. Have the screening done: Once every three years after age 40 if you are at a normal weight and have a low risk for diabetes. More often and at a younger age if you are overweight or have a high risk for diabetes. What should I know about preventing infection? Hepatitis B If you have a higher risk for hepatitis B, you should be screened for this virus. Talk with your health care provider to find out if you are at risk for hepatitis B infection. Hepatitis C Testing is recommended for: Everyone born from 1945 through 1965. Anyone with known risk factors for hepatitis C. Sexually transmitted infections (STIs) Get screened for STIs, including gonorrhea and chlamydia, if: You are sexually active and are younger than 50 years of age. You are older than 50 years of age and your health care provider tells you that you are at risk for this type of infection. Your sexual activity has changed since you were last screened, and you are at increased risk for chlamydia or gonorrhea. Ask your health care provider if you are at risk. Ask your health care provider about whether you are at high risk for HIV. Your health care provider may recommend a prescription medicine   to help prevent HIV infection. If you choose to take medicine to prevent HIV, you should first get tested for HIV. You should then be tested every 3 months for as long as you are taking the medicine. Pregnancy If you are about to stop having your period (premenopausal) and you may become pregnant, seek counseling before you get  pregnant. Take 400 to 800 micrograms (mcg) of folic acid every day if you become pregnant. Ask for birth control (contraception) if you want to prevent pregnancy. Osteoporosis and menopause Osteoporosis is a disease in which the bones lose minerals and strength with aging. This can result in bone fractures. If you are 65 years old or older, or if you are at risk for osteoporosis and fractures, ask your health care provider if you should: Be screened for bone loss. Take a calcium or vitamin D supplement to lower your risk of fractures. Be given hormone replacement therapy (HRT) to treat symptoms of menopause. Follow these instructions at home: Lifestyle Do not use any products that contain nicotine or tobacco, such as cigarettes, e-cigarettes, and chewing tobacco. If you need help quitting, ask your health care provider. Do not use street drugs. Do not share needles. Ask your health care provider for help if you need support or information about quitting drugs. Alcohol use Do not drink alcohol if: Your health care provider tells you not to drink. You are pregnant, may be pregnant, or are planning to become pregnant. If you drink alcohol: Limit how much you use to 0-1 drink a day. Limit intake if you are breastfeeding. Be aware of how much alcohol is in your drink. In the [place] one drink equals one 12 oz bottle of beer (355 mL), one 5 oz glass of wine (148 mL), or one 1 oz glass of hard liquor (44 mL). General instructions Schedule regular health, dental, and eye exams. Stay current with your vaccines. Tell your health care provider if: You often feel depressed. You have ever been abused or do not feel safe at home. Summary Adopting a healthy lifestyle and getting preventive care are important in promoting health and wellness. Follow your health care provider's instructions about healthy diet, exercising, and getting tested or screened for diseases. Follow your health care provider's  instructions on monitoring your cholesterol and blood pressure. This information is not intended to replace advice given to you by your health care provider. Make sure you discuss any questions you have with your health care provider. Document Revised: 05/24/2020 Document Reviewed: 03/09/2018 Elsevier Patient Education  2022 Elsevier Inc.  

## 2020-12-05 NOTE — Assessment & Plan Note (Signed)
Encourage diet and exercise for weight loss We will trial Saxenda, sample given today Rx for Saxenda titration pack sent to pharmacy

## 2020-12-06 NOTE — Telephone Encounter (Signed)
Requested medication (s) are due for refill today -n/a  Requested medication (s) are on the active medication list -n/a  Future visit scheduled -n/a  Last refill: 12/05/20  Notes to clinic: Duplicate request from pharmacy- request change- non formulary  Requested Prescriptions  Pending Prescriptions Disp Refills   SAXENDA 18 MG/3ML SOPN [Pharmacy Med Name: SAXENDA 18 MG/3 ML PEN]  0    Sig: Injection 0.6 mg into skin once daily for 1 week, as tolerated increase by increment of 0.'6mg'$  every 1 week, max dose is '3mg'$  injection daily after 5 weeks.     Endocrinology:  Diabetes - GLP-1 Receptor Agonists Failed - 12/05/2020  4:28 PM      Failed - HBA1C is between 0 and 7.9 and within 180 days    Hgb A1c MFr Bld  Date Value Ref Range Status  10/12/2018 5.8 (H) 4.8 - 5.6 % Final    Comment:             Prediabetes: 5.7 - 6.4          Diabetes: >6.4          Glycemic control for adults with diabetes: <7.0           Passed - Valid encounter within last 6 months    Recent Outpatient Visits           Yesterday Encounter for annual general medical examination with abnormal findings in adult   [place], NP                 Requested Prescriptions  Pending Prescriptions Disp Refills   [place] 18 MG/3ML SOPN [Pharmacy Med Name: SAXENDA 18 MG/3 ML PEN]  0    Sig: Injection 0.6 mg into skin once daily for 1 week, as tolerated increase by increment of 0.'6mg'$  every 1 week, max dose is '3mg'$  injection daily after 5 weeks.     Endocrinology:  Diabetes - GLP-1 Receptor Agonists Failed - 12/05/2020  4:28 PM      Failed - HBA1C is between 0 and 7.9 and within 180 days    Hgb A1c MFr Bld  Date Value Ref Range Status  10/12/2018 5.8 (H) 4.8 - 5.6 % Final    Comment:             Prediabetes: 5.7 - 6.4          Diabetes: >6.4          Glycemic control for adults with diabetes: <7.0           Passed - Valid encounter within last 6 months    Recent Outpatient  Visits           Yesterday Encounter for annual general medical examination with abnormal findings in adult   [facility] [place], Coralie Keens, [place]

## 2021-01-04 ENCOUNTER — Other Ambulatory Visit: Payer: Self-pay | Admitting: Internal Medicine

## 2021-01-04 MED ORDER — OZEMPIC (0.25 OR 0.5 MG/DOSE) 2 MG/1.5ML ~~LOC~~ SOPN: 0.2500 mg | PEN_INJECTOR | SUBCUTANEOUS | 3 refills | Status: DC

## 2021-01-06 ENCOUNTER — Ambulatory Visit: Payer: Commercial Managed Care - PPO | Admitting: Internal Medicine

## 2021-01-30 ENCOUNTER — Ambulatory Visit: Payer: Commercial Managed Care - PPO | Admitting: Internal Medicine

## 2021-01-30 ENCOUNTER — Other Ambulatory Visit: Payer: Self-pay

## 2021-01-30 ENCOUNTER — Encounter: Payer: Self-pay | Admitting: Internal Medicine

## 2021-01-30 VITALS — BP 107/75 | HR 67 | Temp 97.3°F | Resp 18 | Ht 62.0 in | Wt 166.4 lb

## 2021-01-30 DIAGNOSIS — E6609 Other obesity due to excess calories: Secondary | ICD-10-CM

## 2021-01-30 DIAGNOSIS — E781 Pure hyperglyceridemia: Secondary | ICD-10-CM | POA: Insufficient documentation

## 2021-01-30 DIAGNOSIS — J0110 Acute frontal sinusitis, unspecified: Secondary | ICD-10-CM

## 2021-01-30 DIAGNOSIS — R7303 Prediabetes: Secondary | ICD-10-CM | POA: Diagnosis not present

## 2021-01-30 DIAGNOSIS — Z6830 Body mass index (BMI) 30.0-30.9, adult: Secondary | ICD-10-CM

## 2021-01-30 MED ORDER — AMOXICILLIN-POT CLAVULANATE 875-125 MG PO TABS: 1.0000 | ORAL_TABLET | Freq: Two times a day (BID) | ORAL | 0 refills | Status: DC

## 2021-01-30 NOTE — Progress Notes (Signed)
HPI  Pt presents to the clinic today with c/o left ear pain, facial pain and pressure, nasal congestion,  and cough. She reports this started 2 weeks ago. She is blowing green mucous out of her nose. The cough is productive of green mucous. She denies runny nose, sore throat, shortness of breath, fever, chills or body aches. She has tried Sudafed OTC with minimal relief of symptoms.   She would also like her labs done, she did not get them done as part of her physical.  Review of Systems     Past Medical History:  Diagnosis Date   Abdominal pain, LLQ    Abnormal uterine bleeding    Anemia    Cervical dysplasia    Endometriosis    Fibroid    Melanoma ([place])    RT SHOULDER   Paratubal cyst    Pneumonia    Prediabetes     Family History  Problem Relation Age of Onset   Uterine cancer Mother     Social History   Socioeconomic History   Marital status: Married    Spouse name: Not on file   Number of children: Not on file   Years of education: Not on file   Highest education level: Not on file  Occupational History   Not on file  Tobacco Use   Smoking status: Never   Smokeless tobacco: Never  Vaping Use   Vaping Use: Never used  Substance and Sexual Activity   Alcohol use: Yes    Comment: OCCAS   Drug use: No   Sexual activity: Not Currently    Birth control/protection: Surgical  Other Topics Concern   Not on file  Social History Narrative   Not on file   Social Determinants of Health   Financial Resource Strain: Not on file  Food Insecurity: Not on file  Transportation Needs: Not on file  Physical Activity: Not on file  Stress: Not on file  Social Connections: Not on file  Intimate Partner Violence: Not on file    Allergies  Allergen Reactions   Aspirin Shortness Of Breath and Other (See Comments)    Inhale aspirin, no reaction to oral aspirin    Eggs Or Egg-Derived Products Anaphylaxis   Niacin Hives, Rash and Other (See Comments)    Whelps in a  matter of seconds     Constitutional: Denies headache, fatigue, fever or abrupt weight changes.  HEENT:  Positive ear pain, facial pain, nasal congestion. Denies eye redness, ringing in the ears, wax buildup, runny nose or sore throat. Respiratory: Positive cough. Denies difficulty breathing or shortness of breath.  Cardiovascular: Denies chest pain, chest tightness, palpitations or swelling in the hands or feet.   No other specific complaints in a complete review of systems (except as listed in HPI above).  Objective:   BP 107/75 (BP Location: Right Arm, Patient Position: Sitting, Cuff Size: Normal)   Pulse 67   Temp (!) 97.3 F (36.3 C) (Temporal)   Resp 18   Ht 5\' 2"  (1.575 m)   Wt 166 lb 6.4 oz (75.5 kg)   LMP  (LMP Unknown)   SpO2 100%   BMI 30.43 kg/m   General: Appears her stated age, obese in NAD. HEENT: Head: normal shape and size, left frontal sinus tenderness noted; Ears: Tm's gray and intact, normal light reflex, + serous effusion;  Neck:  No adenopathy noted.  Cardiovascular: Normal rate and rhythm. S1,S2 noted.  No murmur, rubs or gallops noted.  Pulmonary/Chest:  Normal effort and positive vesicular breath sounds. No respiratory distress. No wheezes, rales or ronchi noted.       Assessment & Plan:   Acute Frontal Sinusitis  Can use a Neti Pot which can be purchased from your local drug store. Flonase 2 sprays each nostril for 3 days and then as needed. eRx for Augmentin BID for 10 days  RTC as needed or if symptoms persist. Webb Silversmith, NP This visit occurred during the SARS-CoV-2 public health emergency.  Safety protocols were in place, including screening questions prior to the visit, additional usage of staff PPE, and extensive cleaning of exam room while observing appropriate contact time as indicated for disinfecting solutions.

## 2021-01-30 NOTE — Assessment & Plan Note (Signed)
Encouraged diet and exercise for weight loss ?

## 2021-01-30 NOTE — Patient Instructions (Signed)

## 2021-01-30 NOTE — Assessment & Plan Note (Signed)
Encouraged diet and exercise for weight loss Continue Ozempic

## 2021-01-30 NOTE — Assessment & Plan Note (Signed)
Encouraged low fat diet 

## 2021-01-31 LAB — COMPLETE METABOLIC PANEL WITH GFR
AG Ratio: 1.6 (calc) (ref 1.0–2.5)
ALT: 23 U/L (ref 6–29)
AST: 16 U/L (ref 10–35)
Albumin: 4.4 g/dL (ref 3.6–5.1)
Alkaline phosphatase (APISO): 80 U/L (ref 37–153)
BUN: 18 mg/dL (ref 7–25)
CO2: 26 mmol/L (ref 20–32)
Calcium: 10 mg/dL (ref 8.6–10.4)
Chloride: 104 mmol/L (ref 98–110)
Creat: 0.67 mg/dL (ref 0.50–1.03)
Globulin: 2.7 g/dL (calc) (ref 1.9–3.7)
Glucose, Bld: 101 mg/dL — ABNORMAL HIGH (ref 65–99)
Potassium: 4.3 mmol/L (ref 3.5–5.3)
Sodium: 141 mmol/L (ref 135–146)
Total Bilirubin: 0.4 mg/dL (ref 0.2–1.2)
Total Protein: 7.1 g/dL (ref 6.1–8.1)
eGFR: 106 mL/min/{1.73_m2} (ref >=60)

## 2021-01-31 LAB — CBC
HCT: 41.8 % (ref 35.0–45.0)
Hemoglobin: 13.9 g/dL (ref 11.7–15.5)
MCH: 28.7 pg (ref 27.0–33.0)
MCHC: 33.3 g/dL (ref 32.0–36.0)
MCV: 86.2 fL (ref 80.0–100.0)
MPV: 10.1 fL (ref 7.5–12.5)
Platelets: 332 10*3/uL (ref 140–400)
RBC: 4.85 10*6/uL (ref 3.80–5.10)
RDW: 13.1 % (ref 11.0–15.0)
WBC: 6.9 10*3/uL (ref 3.8–10.8)

## 2021-01-31 LAB — LIPID PANEL
Cholesterol: 233 mg/dL — ABNORMAL HIGH (ref <200)
HDL: 55 mg/dL (ref >=50)
LDL Cholesterol (Calc): 146 mg/dL (calc) — ABNORMAL HIGH
Non-HDL Cholesterol (Calc): 178 mg/dL (calc) — ABNORMAL HIGH (ref <130)
Total CHOL/HDL Ratio: 4.2 (calc) (ref <5.0)
Triglycerides: 186 mg/dL — ABNORMAL HIGH (ref <150)

## 2021-01-31 LAB — HEMOGLOBIN A1C
Hgb A1c MFr Bld: 5.6 % of total Hgb (ref <5.7)
Mean Plasma Glucose: 114 mg/dL
eAG (mmol/L): 6.3 mmol/L

## 2021-01-31 LAB — TSH: TSH: 2.18 mIU/L

## 2021-02-04 ENCOUNTER — Telehealth: Payer: Self-pay

## 2021-02-04 NOTE — Telephone Encounter (Signed)
The pt was notified of her lab results. She verbalize understanding, no questions or concerns.

## 2021-03-25 ENCOUNTER — Other Ambulatory Visit: Payer: Self-pay | Admitting: Family Medicine

## 2021-03-25 DIAGNOSIS — Z1231 Encounter for screening mammogram for malignant neoplasm of breast: Secondary | ICD-10-CM

## 2021-03-28 ENCOUNTER — Other Ambulatory Visit: Payer: Self-pay | Admitting: Internal Medicine

## 2021-03-28 MED ORDER — TIRZEPATIDE 2.5 MG/0.5ML ~~LOC~~ SOAJ: 2.5000 mg | SUBCUTANEOUS | 2 refills | Status: DC

## 2021-04-07 ENCOUNTER — Ambulatory Visit: Payer: Commercial Managed Care - PPO | Admitting: Internal Medicine

## 2021-04-10 ENCOUNTER — Encounter: Payer: Self-pay | Admitting: Internal Medicine

## 2021-04-10 ENCOUNTER — Ambulatory Visit: Payer: Commercial Managed Care - PPO | Admitting: Internal Medicine

## 2021-04-10 ENCOUNTER — Other Ambulatory Visit: Payer: Self-pay

## 2021-04-10 DIAGNOSIS — Z6829 Body mass index (BMI) 29.0-29.9, adult: Secondary | ICD-10-CM | POA: Insufficient documentation

## 2021-04-10 DIAGNOSIS — R7303 Prediabetes: Secondary | ICD-10-CM | POA: Diagnosis not present

## 2021-04-10 DIAGNOSIS — E781 Pure hyperglyceridemia: Secondary | ICD-10-CM

## 2021-04-10 DIAGNOSIS — E663 Overweight: Secondary | ICD-10-CM

## 2021-04-10 DIAGNOSIS — Z6825 Body mass index (BMI) 25.0-25.9, adult: Secondary | ICD-10-CM | POA: Insufficient documentation

## 2021-04-10 DIAGNOSIS — Z6826 Body mass index (BMI) 26.0-26.9, adult: Secondary | ICD-10-CM | POA: Insufficient documentation

## 2021-04-10 MED ORDER — OZEMPIC (2 MG/DOSE) 8 MG/3ML ~~LOC~~ SOPN: 2.0000 mg | PEN_INJECTOR | SUBCUTANEOUS | 2 refills | Status: DC

## 2021-04-10 NOTE — Assessment & Plan Note (Signed)
Encouraged low fat, low carb diet

## 2021-04-10 NOTE — Patient Instructions (Signed)

## 2021-04-10 NOTE — Addendum Note (Signed)
Addended by: Jearld Fenton on: 04/10/2021 10:26 AM   Modules accepted: Orders

## 2021-04-10 NOTE — Progress Notes (Signed)
Subjective:    Patient ID: Tracy Madden, female    DOB: 27-Jan-1971, 51 y.o.   MRN: [id number]  HPI  Patient presents the clinic today to discuss weight loss management.  She is currently taking Ozempic 1mg  weekly. She admits that the Ozempic is costly. She knows that she could do better with diet and exercise, but her schedule is so hectic, she is having a hard time settling into a routine.  Her current weight today is 161.4 lbs with a BMI of 29.52.  She has a history of HLD and prediabetes.  Review of Systems     Past Medical History:  Diagnosis Date   Abdominal pain, LLQ    Abnormal uterine bleeding    Anemia    Cervical dysplasia    Endometriosis    Fibroid    Melanoma ([place])    RT SHOULDER   Paratubal cyst    Pneumonia    Prediabetes     Current Outpatient Medications  Medication Sig Dispense Refill   amoxicillin-clavulanate (AUGMENTIN) 875-125 MG tablet Take 1 tablet by mouth 2 (two) times daily. 20 tablet 0   Ascorbic Acid (VITAMIN C PO) Take by mouth. As needed     Insulin Pen Needle 31G X 5 MM MISC BD Pen Needles- brand specific Inject insulin via insulin pen 6 x daily 90 each 3   MAGNESIUM PO Take by mouth. As needed     metFORMIN (GLUCOPHAGE-XR) 500 MG 24 hr tablet Take 500 mg by mouth daily as needed.     Multiple Vitamins-Minerals (ZINC PO) Take by mouth. As needed     tirzepatide [facility]) 2.5 MG/0.5ML Pen Inject 2.5 mg into the skin once a week. 2 mL 2   VITAMIN D PO Take by mouth. As needed     No current facility-administered medications for this visit.    Allergies  Allergen Reactions   Aspirin Shortness Of Breath and Other (See Comments)    Inhale aspirin, no reaction to oral aspirin    Eggs Or Egg-Derived Products Anaphylaxis   Niacin Hives, Rash and Other (See Comments)    Whelps in a matter of seconds    Family History  Problem Relation Age of Onset   Uterine cancer Mother     Social History   Socioeconomic History   Marital  status: Married    Spouse name: Not on file   Number of children: Not on file   Years of education: Not on file   Highest education level: Not on file  Occupational History   Not on file  Tobacco Use   Smoking status: Never   Smokeless tobacco: Never  Vaping Use   Vaping Use: Never used  Substance and Sexual Activity   Alcohol use: Yes    Comment: OCCAS   Drug use: No   Sexual activity: Not Currently    Birth control/protection: Surgical  Other Topics Concern   Not on file  Social History Narrative   Not on file   Social Determinants of Health   Financial Resource Strain: Not on file  Food Insecurity: Not on file  Transportation Needs: Not on file  Physical Activity: Not on file  Stress: Not on file  Social Connections: Not on file  Intimate Partner Violence: Not on file     Constitutional: Denies fever, malaise, fatigue, headache or abrupt weight changes.  Respiratory: Denies difficulty breathing, shortness of breath, cough or sputum production.   Cardiovascular: Denies chest pain, chest tightness, palpitations or  swelling in the hands or feet.  Gastrointestinal: Denies abdominal pain, bloating, constipation, diarrhea or blood in the stool.  Neurological: Denies dizziness, difficulty with memory, difficulty with speech or problems with balance and coordination.    No other specific complaints in a complete review of systems (except as listed in HPI above).  Objective:   Physical Exam BP 95/75 (BP Location: Left Arm, Patient Position: Sitting, Cuff Size: Normal)    Pulse 75    Temp (!) 97.5 F (36.4 C) (Temporal)    Resp 17    Ht 5\' 2"  (1.575 m)    Wt 161 lb 6.4 oz (73.2 kg)    LMP  (LMP Unknown)    SpO2 100%    BMI 29.52 kg/m   Wt Readings from Last 3 Encounters:  01/30/21 166 lb 6.4 oz (75.5 kg)  12/05/20 164 lb 6.4 oz (74.6 kg)  11/04/20 163 lb (73.9 kg)    General: Appears her stated age, overweight, in NAD. Cardiovascular: Normal rate. Pulmonary/Chest:  Normal effort. Neurological: Alert and oriented.   BMET    Component Value Date/Time   NA 141 01/30/2021 1014   NA 143 10/12/2018 1519   K 4.3 01/30/2021 1014   CL 104 01/30/2021 1014   CO2 26 01/30/2021 1014   GLUCOSE 101 (H) 01/30/2021 1014   BUN 18 01/30/2021 1014   BUN 13 10/12/2018 1519   CREATININE 0.67 01/30/2021 1014   CALCIUM 10.0 01/30/2021 1014   GFRNONAA 85 10/12/2018 1519   GFRAA 98 10/12/2018 1519    Lipid Panel     Component Value Date/Time   CHOL 233 (H) 01/30/2021 1014   CHOL 182 10/12/2018 1519   TRIG 186 (H) 01/30/2021 1014   HDL 55 01/30/2021 1014   HDL 41 10/12/2018 1519   CHOLHDL 4.2 01/30/2021 1014   LDLCALC 146 (H) 01/30/2021 1014    CBC    Component Value Date/Time   WBC 6.9 01/30/2021 1014   RBC 4.85 01/30/2021 1014   HGB 13.9 01/30/2021 1014   HGB 13.4 10/12/2018 1519   HCT 41.8 01/30/2021 1014   HCT 39.6 10/12/2018 1519   PLT 332 01/30/2021 1014   PLT 315 10/12/2018 1519   MCV 86.2 01/30/2021 1014   MCV 85 10/12/2018 1519   MCH 28.7 01/30/2021 1014   MCHC 33.3 01/30/2021 1014   RDW 13.1 01/30/2021 1014   RDW 12.7 10/12/2018 1519   LYMPHSABS 1.8 03/12/2016 1041   MONOABS 0.4 03/12/2016 1041   EOSABS 0.1 03/12/2016 1041   BASOSABS 0.0 03/12/2016 1041    Hgb A1C Lab Results  Component Value Date   HGBA1C 5.6 01/30/2021            Assessment & Plan:    Webb Silversmith, NP This visit occurred during the SARS-CoV-2 public health emergency.  Safety protocols were in place, including screening questions prior to the visit, additional usage of staff PPE, and extensive cleaning of exam room while observing appropriate contact time as indicated for disinfecting solutions.

## 2021-04-10 NOTE — Assessment & Plan Note (Signed)
Will titrate Ozempic up from 1 mg to 2 mg weekly Encouraged high protein diet consisting mainly of veggies Avoid fruits and processed foods Eat 6 small meals as opposed to 3 big meals daily Encouraged 150 minutes of exercise weekly

## 2021-04-29 ENCOUNTER — Ambulatory Visit: Payer: Commercial Managed Care - PPO

## 2021-05-02 ENCOUNTER — Other Ambulatory Visit: Payer: Self-pay | Admitting: Internal Medicine

## 2021-05-02 NOTE — Telephone Encounter (Signed)
Dose was increased to 2mg  on 04/10/21, pt has refills left on this rx.  Requested Prescriptions  Pending Prescriptions Disp Refills   OZEMPIC, 0.25 OR 0.5 MG/DOSE, 2 MG/1.5ML SOPN [Pharmacy Med Name: OZEMPIC 0.25-0.5 MG/DOSE PEN]  3    Sig: INJECT 0.25 MG INTO THE SKIN ONCE A WEEK. FOR FIRST 4 WEEKS. THEN INCREASE DOSE TO 0.5MG  WEEKLY     Endocrinology:  Diabetes - GLP-1 Receptor Agonists - semaglutide Passed - 05/02/2021  9:35 AM      Passed - HBA1C in normal range and within 180 days    Hgb A1c MFr Bld  Date Value Ref Range Status  01/30/2021 5.6 <5.7 % of total Hgb Final    Comment:    For the purpose of screening for the presence of diabetes: . <5.7%       Consistent with the absence of diabetes 5.7-6.4%    Consistent with increased risk for diabetes             (prediabetes) > or =6.5%  Consistent with diabetes . This assay result is consistent with a decreased risk of diabetes. . Currently, no consensus exists regarding use of hemoglobin A1c for diagnosis of diabetes in children. . According to American Diabetes Association (ADA) guidelines, hemoglobin A1c <7.0% represents optimal control in non-pregnant diabetic patients. Different metrics may apply to specific patient populations.  Standards of Medical Care in Diabetes(ADA). .          Passed - Cr in normal range and within 360 days    Creat  Date Value Ref Range Status  01/30/2021 0.67 0.50 - 1.03 mg/dL Final         Passed - Valid encounter within last 6 months    Recent Outpatient Visits          3 weeks ago Overweight with body mass index (BMI) of 29 to 29.9 in adult   [place], [place], NP   3 months ago Class 1 obesity due to excess calories with serious comorbidity and body mass index (BMI) of 30.0 to 30.9 in adult   [place], Coralie Keens, NP   4 months ago Encounter for annual general medical examination with abnormal findings in adult   [facility] [place], Coralie Keens, [place]

## 2021-05-27 ENCOUNTER — Other Ambulatory Visit: Payer: Self-pay | Admitting: Internal Medicine

## 2021-06-03 ENCOUNTER — Other Ambulatory Visit: Payer: Self-pay

## 2021-06-03 ENCOUNTER — Ambulatory Visit
Admission: RE | Admit: 2021-06-03 | Discharge: 2021-06-03 | Disposition: A | Discharge status: Home / Self Care | Payer: Commercial Managed Care - PPO | Source: Ambulatory Visit | Attending: Family Medicine | Admitting: Family Medicine

## 2021-06-03 DIAGNOSIS — Z1231 Encounter for screening mammogram for malignant neoplasm of breast: Secondary | ICD-10-CM

## 2021-06-06 ENCOUNTER — Other Ambulatory Visit: Payer: Self-pay | Admitting: Family Medicine

## 2021-06-06 DIAGNOSIS — R928 Other abnormal and inconclusive findings on diagnostic imaging of breast: Secondary | ICD-10-CM

## 2021-06-06 DIAGNOSIS — N6489 Other specified disorders of breast: Secondary | ICD-10-CM

## 2021-06-19 ENCOUNTER — Other Ambulatory Visit: Payer: Self-pay

## 2021-06-19 ENCOUNTER — Ambulatory Visit
Admission: RE | Admit: 2021-06-19 | Discharge: 2021-06-19 | Disposition: A | Discharge status: Home / Self Care | Payer: Commercial Managed Care - PPO | Source: Ambulatory Visit | Attending: Family Medicine | Admitting: Family Medicine

## 2021-06-19 DIAGNOSIS — N6489 Other specified disorders of breast: Secondary | ICD-10-CM

## 2021-06-19 DIAGNOSIS — R928 Other abnormal and inconclusive findings on diagnostic imaging of breast: Secondary | ICD-10-CM

## 2021-08-28 ENCOUNTER — Other Ambulatory Visit: Payer: Self-pay | Admitting: Internal Medicine

## 2021-08-28 MED ORDER — OZEMPIC (2 MG/DOSE) 8 MG/3ML ~~LOC~~ SOPN: 2.0000 mg | PEN_INJECTOR | SUBCUTANEOUS | 0 refills | Status: DC

## 2021-10-16 ENCOUNTER — Encounter: Payer: Self-pay | Admitting: Internal Medicine

## 2021-10-16 ENCOUNTER — Ambulatory Visit: Payer: Commercial Managed Care - PPO | Admitting: Internal Medicine

## 2021-10-16 VITALS — BP 102/60 | HR 72 | Temp 97.1°F | Wt 139.0 lb

## 2021-10-16 DIAGNOSIS — M546 Pain in thoracic spine: Secondary | ICD-10-CM | POA: Diagnosis not present

## 2021-10-16 DIAGNOSIS — N2889 Other specified disorders of kidney and ureter: Secondary | ICD-10-CM

## 2021-10-16 DIAGNOSIS — G8929 Other chronic pain: Secondary | ICD-10-CM

## 2021-10-16 DIAGNOSIS — N281 Cyst of kidney, acquired: Secondary | ICD-10-CM | POA: Diagnosis not present

## 2021-10-16 DIAGNOSIS — K7689 Other specified diseases of liver: Secondary | ICD-10-CM

## 2021-10-16 NOTE — Progress Notes (Signed)
Subjective:    Patient ID: Tracy Madden, female    DOB: 04-Mar-1971, 51 y.o.   MRN: [id number]  HPI  Patient presents to clinic today with complaint of left mid back pain.  This started 2 months ago.  She describes the pain as nagging.  The pain can be worse with lifting.  The pain does not seem to radiate.  She denies numbness, tingling, weakness of her lower extremities.  She denies urinary or vaginal complaints.  She denies loss of bowel or bladder control.  She has not taken anything OTC for this. She has tried heat with some relief.  She has cyst on her liver and kidneys and is concerned that this may be getting worse and would like additional imaging for reevaluation of these.  Review of Systems  Past Medical History:  Diagnosis Date   Abdominal pain, LLQ    Abnormal uterine bleeding    Anemia    Cervical dysplasia    Endometriosis    Fibroid    Melanoma ([place])    RT SHOULDER   Paratubal cyst    Pneumonia    Prediabetes     Current Outpatient Medications  Medication Sig Dispense Refill   Ascorbic Acid (VITAMIN C PO) Take by mouth. As needed     Insulin Pen Needle 31G X 5 MM MISC BD Pen Needles- brand specific Inject insulin via insulin pen 6 x daily 90 each 3   MAGNESIUM PO Take by mouth. As needed     Multiple Vitamins-Minerals (ZINC PO) Take by mouth. As needed     OZEMPIC, 2 MG/DOSE, 8 MG/3ML SOPN Inject 2 mg into the skin once a week. 9 mL 0   VITAMIN D PO Take by mouth. As needed     No current facility-administered medications for this visit.    Allergies  Allergen Reactions   Eggs Or Egg-Derived Products Anaphylaxis   Niacin Hives, Rash and Other (See Comments)    Whelps in a matter of seconds    Family History  Problem Relation Age of Onset   Uterine cancer Mother     Social History   Socioeconomic History   Marital status: Married    Spouse name: Not on file   Number of children: Not on file   Years of education: Not on file   Highest  education level: Not on file  Occupational History   Not on file  Tobacco Use   Smoking status: Never   Smokeless tobacco: Never  Vaping Use   Vaping Use: Never used  Substance and Sexual Activity   Alcohol use: Yes    Comment: OCCAS   Drug use: No   Sexual activity: Not Currently    Birth control/protection: Surgical  Other Topics Concern   Not on file  Social History Narrative   Not on file   Social Determinants of Health   Financial Resource Strain: Not on file  Food Insecurity: Not on file  Transportation Needs: Not on file  Physical Activity: Not on file  Stress: Not on file  Social Connections: Not on file  Intimate Partner Violence: Not on file     Constitutional: Denies fever, malaise, fatigue, headache or abrupt weight changes.  Respiratory: Denies difficulty breathing, shortness of breath, cough or sputum production.   Cardiovascular: Denies chest pain, chest tightness, palpitations or swelling in the hands or feet.  Gastrointestinal: Denies abdominal pain, bloating, constipation, diarrhea or blood in the stool.  GU: Denies urgency, frequency, pain  with urination, burning sensation, blood in urine, odor or discharge. Musculoskeletal: Patient reports left mid back pain.  Denies decrease in range of motion, difficulty with gait, or joint swelling.  Skin: Denies redness, rashes, lesions or ulcercations.  Neurological: Denies numbness, tingling, weakness or problems with balance and coordination.    No other specific complaints in a complete review of systems (except as listed in HPI above).     Objective:   Physical Exam   BP 102/60 (BP Location: Left Arm, Patient Position: Sitting, Cuff Size: Normal)   Pulse 72   Temp (!) 97.1 F (36.2 C) (Temporal)   Wt 139 lb (63 kg)   LMP  (LMP Unknown)   SpO2 98%   BMI 25.42 kg/m   Wt Readings from Last 3 Encounters:  04/10/21 161 lb 6.4 oz (73.2 kg)  01/30/21 166 lb 6.4 oz (75.5 kg)  12/05/20 164 lb 6.4 oz  (74.6 kg)    General: Appears her stated age, well developed, well nourished in NAD. Skin: Warm, dry and intact. No rashes noted. Cardiovascular: Normal rate and rhythm.  Pulmonary/Chest: Normal effort and positive vesicular breath sounds. No respiratory distress. No wheezes, rales or ronchi noted.  Musculoskeletal: Normal flexion, extension, rotation of the spine.  No bony tenderness noted over the lumbar spine.  Pain with palpation of the left parathoracic muscles.  No difficulty with gait.  Neurological: Alert and oriented.   BMET    Component Value Date/Time   NA 141 01/30/2021 1014   NA 143 10/12/2018 1519   K 4.3 01/30/2021 1014   CL 104 01/30/2021 1014   CO2 26 01/30/2021 1014   GLUCOSE 101 (H) 01/30/2021 1014   BUN 18 01/30/2021 1014   BUN 13 10/12/2018 1519   CREATININE 0.67 01/30/2021 1014   CALCIUM 10.0 01/30/2021 1014   GFRNONAA 85 10/12/2018 1519   GFRAA 98 10/12/2018 1519    Lipid Panel     Component Value Date/Time   CHOL 233 (H) 01/30/2021 1014   CHOL 182 10/12/2018 1519   TRIG 186 (H) 01/30/2021 1014   HDL 55 01/30/2021 1014   HDL 41 10/12/2018 1519   CHOLHDL 4.2 01/30/2021 1014   LDLCALC 146 (H) 01/30/2021 1014    CBC    Component Value Date/Time   WBC 6.9 01/30/2021 1014   RBC 4.85 01/30/2021 1014   HGB 13.9 01/30/2021 1014   HGB 13.4 10/12/2018 1519   HCT 41.8 01/30/2021 1014   HCT 39.6 10/12/2018 1519   PLT 332 01/30/2021 1014   PLT 315 10/12/2018 1519   MCV 86.2 01/30/2021 1014   MCV 85 10/12/2018 1519   MCH 28.7 01/30/2021 1014   MCHC 33.3 01/30/2021 1014   RDW 13.1 01/30/2021 1014   RDW 12.7 10/12/2018 1519   LYMPHSABS 1.8 03/12/2016 1041   MONOABS 0.4 03/12/2016 1041   EOSABS 0.1 03/12/2016 1041   BASOSABS 0.0 03/12/2016 1041    Hgb A1C Lab Results  Component Value Date   HGBA1C 5.6 01/30/2021           Assessment & Plan:   Left Mid Back Pain, Liver Cyst, Renal Cyst:  Likely muscular Encouraged heat and  stretching C-Met today We will obtain CT abdomen pelvis for further evaluation of liver and kidney cysts  RTC in 2 months for your annual exam Webb Silversmith, NP

## 2021-10-16 NOTE — Patient Instructions (Signed)

## 2021-10-17 LAB — COMPLETE METABOLIC PANEL WITH GFR
AG Ratio: 2 (calc) (ref 1.0–2.5)
ALT: 12 U/L (ref 6–29)
AST: 14 U/L (ref 10–35)
Albumin: 4.6 g/dL (ref 3.6–5.1)
Alkaline phosphatase (APISO): 68 U/L (ref 37–153)
BUN: 20 mg/dL (ref 7–25)
CO2: 25 mmol/L (ref 20–32)
Calcium: 9.8 mg/dL (ref 8.6–10.4)
Chloride: 106 mmol/L (ref 98–110)
Creat: 0.73 mg/dL (ref 0.50–1.03)
Globulin: 2.3 g/dL (calc) (ref 1.9–3.7)
Glucose, Bld: 85 mg/dL (ref 65–139)
Potassium: 4.4 mmol/L (ref 3.5–5.3)
Sodium: 142 mmol/L (ref 135–146)
Total Bilirubin: 0.3 mg/dL (ref 0.2–1.2)
Total Protein: 6.9 g/dL (ref 6.1–8.1)
eGFR: 100 mL/min/{1.73_m2} (ref >=60)

## 2021-10-21 ENCOUNTER — Inpatient Hospital Stay: Admission: RE | Admit: 2021-10-21 | Payer: Commercial Managed Care - PPO | Source: Ambulatory Visit

## 2021-10-22 ENCOUNTER — Ambulatory Visit
Admission: RE | Admit: 2021-10-22 | Discharge: 2021-10-22 | Disposition: A | Discharge status: Home / Self Care | Payer: Commercial Managed Care - PPO | Source: Ambulatory Visit | Attending: Internal Medicine | Admitting: Internal Medicine

## 2021-10-22 DIAGNOSIS — G8929 Other chronic pain: Secondary | ICD-10-CM | POA: Insufficient documentation

## 2021-10-22 DIAGNOSIS — M546 Pain in thoracic spine: Secondary | ICD-10-CM | POA: Insufficient documentation

## 2021-10-22 DIAGNOSIS — N2889 Other specified disorders of kidney and ureter: Secondary | ICD-10-CM | POA: Diagnosis present

## 2021-10-22 DIAGNOSIS — K7689 Other specified diseases of liver: Secondary | ICD-10-CM | POA: Diagnosis present

## 2021-10-22 DIAGNOSIS — N281 Cyst of kidney, acquired: Secondary | ICD-10-CM | POA: Diagnosis present

## 2021-10-22 MED ORDER — IOHEXOL 300 MG/ML  SOLN
100.0000 mL | Freq: Once | INTRAMUSCULAR | Status: AC | PRN
  Administered 2021-10-22: 100 mL via INTRAVENOUS

## 2021-12-23 ENCOUNTER — Ambulatory Visit (INDEPENDENT_AMBULATORY_CARE_PROVIDER_SITE_OTHER): Payer: Commercial Managed Care - PPO | Admitting: Internal Medicine

## 2021-12-23 ENCOUNTER — Encounter: Payer: Self-pay | Admitting: Internal Medicine

## 2021-12-23 VITALS — BP 70/45 | HR 73 | Ht 62.0 in | Wt 139.8 lb

## 2021-12-23 DIAGNOSIS — Z1159 Encounter for screening for other viral diseases: Secondary | ICD-10-CM

## 2021-12-23 DIAGNOSIS — R7303 Prediabetes: Secondary | ICD-10-CM | POA: Diagnosis not present

## 2021-12-23 DIAGNOSIS — Z114 Encounter for screening for human immunodeficiency virus [HIV]: Secondary | ICD-10-CM

## 2021-12-23 DIAGNOSIS — Z6825 Body mass index (BMI) 25.0-25.9, adult: Secondary | ICD-10-CM

## 2021-12-23 DIAGNOSIS — Z0001 Encounter for general adult medical examination with abnormal findings: Secondary | ICD-10-CM

## 2021-12-23 DIAGNOSIS — E663 Overweight: Secondary | ICD-10-CM

## 2021-12-23 NOTE — Patient Instructions (Signed)

## 2021-12-23 NOTE — Progress Notes (Signed)
Subjective:    Patient ID: Tracy Madden, female    DOB: 11-03-1970, 51 y.o.   MRN: [id number]  HPI  Pt presents to the clinic today for her annual exam.  Flu: 12/2020 Tetanus: > 10 years ago Covid: Moderna x 2 Pap smear: hysterectomy Mammogram: 05/2021 Colon screening: 06/2015 Vision screening: annually Dentist: biannually  Diet: She does eat meat. She consumes fruit and veggies. She tries to avoid fried foods. She drinks mostly juice. Exercise: Walking  Review of Systems     Past Medical History:  Diagnosis Date   Abdominal pain, LLQ    Abnormal uterine bleeding    Anemia    Cervical dysplasia    Endometriosis    Fibroid    Melanoma ([place])    RT SHOULDER   Paratubal cyst    Pneumonia    Prediabetes     Current Outpatient Medications  Medication Sig Dispense Refill   OZEMPIC, 2 MG/DOSE, 8 MG/3ML SOPN Inject 2 mg into the skin once a week. 9 mL 0   VITAMIN D PO Take by mouth. As needed     No current facility-administered medications for this visit.    Allergies  Allergen Reactions   Eggs Or Egg-Derived Products Anaphylaxis   Niacin Hives, Rash and Other (See Comments)    Whelps in a matter of seconds    Family History  Problem Relation Age of Onset   Uterine cancer Mother     Social History   Socioeconomic History   Marital status: Married    Spouse name: Not on file   Number of children: Not on file   Years of education: Not on file   Highest education level: Not on file  Occupational History   Not on file  Tobacco Use   Smoking status: Never   Smokeless tobacco: Never  Vaping Use   Vaping Use: Never used  Substance and Sexual Activity   Alcohol use: Yes    Comment: OCCAS   Drug use: No   Sexual activity: Not Currently    Birth control/protection: Surgical  Other Topics Concern   Not on file  Social History Narrative   Not on file   Social Determinants of Health   Financial Resource Strain: Not on file  Food Insecurity: Not  on file  Transportation Needs: Not on file  Physical Activity: Not on file  Stress: Not on file  Social Connections: Not on file  Intimate Partner Violence: Not on file     Constitutional: Denies fever, malaise, fatigue, headache or abrupt weight changes.  HEENT: Denies eye pain, eye redness, ear pain, ringing in the ears, wax buildup, runny nose, nasal congestion, bloody nose, or sore throat. Respiratory: Denies difficulty breathing, shortness of breath, cough or sputum production.   Cardiovascular: Denies chest pain, chest tightness, palpitations or swelling in the hands or feet.  Gastrointestinal: Pt reports intermittent constipation. Denies abdominal pain, bloating, constipation, diarrhea or blood in the stool.  GU: Denies urgency, frequency, pain with urination, burning sensation, blood in urine, odor or discharge. Musculoskeletal: Denies decrease in range of motion, difficulty with gait, muscle pain or joint pain and swelling.  Skin: Denies redness, rashes, lesions or ulcercations.  Neurological: Denies dizziness, difficulty with memory, difficulty with speech or problems with balance and coordination.  Psych: Denies anxiety, depression, SI/HI.  No other specific complaints in a complete review of systems (except as listed in HPI above).  Objective:   Physical Exam   BP (!) 70/45  Pulse 73   Ht _0  (1.575 m)   Wt 139 lb 12.8 oz (63.4 kg)   LMP  (LMP Unknown)   SpO2 100%   BMI 25.57 kg/m   Wt Readings from Last 3 Encounters:  10/16/21 139 lb (63 kg)  04/10/21 161 lb 6.4 oz (73.2 kg)  01/30/21 166 lb 6.4 oz (75.5 kg)    General: Appears her stated age, overweight, in NAD. Skin: Warm, dry and intact.  HEENT: Head: normal shape and size; Eyes: sclera white, no icterus, conjunctiva pink, PERRLA and EOMs intact;  Neck:  Neck supple, trachea midline. No masses, lumps or thyromegaly present.  Cardiovascular: Normal rate and rhythm. S1,S2 noted.  No murmur, rubs or  gallops noted. No JVD or BLE edema. No carotid bruits noted. Pulmonary/Chest: Normal effort and positive vesicular breath sounds. No respiratory distress. No wheezes, rales or ronchi noted.  Abdomen:  Normal bowel sounds.  Musculoskeletal: Strength 5/5 BUE/BLE. No difficulty with gait.  Neurological: Alert and oriented. Cranial nerves II-XII grossly intact. Coordination normal.  Psychiatric: Mood and affect normal. Behavior is normal. Judgment and thought content normal.    BMET    Component Value Date/Time   NA 142 10/16/2021 1332   NA 143 10/12/2018 1519   K 4.4 10/16/2021 1332   CL 106 10/16/2021 1332   CO2 25 10/16/2021 1332   GLUCOSE 85 10/16/2021 1332   BUN 20 10/16/2021 1332   BUN 13 10/12/2018 1519   CREATININE 0.73 10/16/2021 1332   CALCIUM 9.8 10/16/2021 1332   GFRNONAA 85 10/12/2018 1519   GFRAA 98 10/12/2018 1519    Lipid Panel     Component Value Date/Time   CHOL 233 (H) 01/30/2021 1014   CHOL 182 10/12/2018 1519   TRIG 186 (H) 01/30/2021 1014   HDL 55 01/30/2021 1014   HDL 41 10/12/2018 1519   CHOLHDL 4.2 01/30/2021 1014   LDLCALC 146 (H) 01/30/2021 1014    CBC    Component Value Date/Time   WBC 6.9 01/30/2021 1014   RBC 4.85 01/30/2021 1014   HGB 13.9 01/30/2021 1014   HGB 13.4 10/12/2018 1519   HCT 41.8 01/30/2021 1014   HCT 39.6 10/12/2018 1519   PLT 332 01/30/2021 1014   PLT 315 10/12/2018 1519   MCV 86.2 01/30/2021 1014   MCV 85 10/12/2018 1519   MCH 28.7 01/30/2021 1014   MCHC 33.3 01/30/2021 1014   RDW 13.1 01/30/2021 1014   RDW 12.7 10/12/2018 1519   LYMPHSABS 1.8 03/12/2016 1041   MONOABS 0.4 03/12/2016 1041   EOSABS 0.1 03/12/2016 1041   BASOSABS 0.0 03/12/2016 1041    Hgb A1C Lab Results  Component Value Date   HGBA1C 5.6 01/30/2021           Assessment & Plan:   Preventative Health Maintenance:  She will get a flu shot at work Tetanus declined Encouraged her to get a COVID booster She no longer needs Pap  smears Mammogram UTD Thank urged her to get her COVID booster Discussed Shingrix vaccine, she will check coverage with her insurance company and schedule a nurse visit if she would like to have this done Encouraged her to consume a balanced diet and exercise regimen Advised her to see an eye doctor and dentist annually We will check CBC, c-Met, lipid, A1c, HIV and hep C today  RTC in 1 year for your annual exam Webb Silversmith, NP

## 2021-12-23 NOTE — Assessment & Plan Note (Signed)
Encourage diet and exercise for weight loss 

## 2021-12-24 LAB — HEMOGLOBIN A1C
Hgb A1c MFr Bld: 5.5 % of total Hgb (ref <5.7)
Mean Plasma Glucose: 111 mg/dL
eAG (mmol/L): 6.2 mmol/L

## 2021-12-24 LAB — COMPLETE METABOLIC PANEL WITH GFR
AG Ratio: 2.1 (calc) (ref 1.0–2.5)
ALT: 12 U/L (ref 6–29)
AST: 15 U/L (ref 10–35)
Albumin: 4.5 g/dL (ref 3.6–5.1)
Alkaline phosphatase (APISO): 66 U/L (ref 37–153)
BUN: 16 mg/dL (ref 7–25)
CO2: 28 mmol/L (ref 20–32)
Calcium: 9.6 mg/dL (ref 8.6–10.4)
Chloride: 103 mmol/L (ref 98–110)
Creat: 0.66 mg/dL (ref 0.50–1.03)
Globulin: 2.1 g/dL (calc) (ref 1.9–3.7)
Glucose, Bld: 90 mg/dL (ref 65–139)
Potassium: 4 mmol/L (ref 3.5–5.3)
Sodium: 139 mmol/L (ref 135–146)
Total Bilirubin: 0.4 mg/dL (ref 0.2–1.2)
Total Protein: 6.6 g/dL (ref 6.1–8.1)
eGFR: 106 mL/min/{1.73_m2} (ref >=60)

## 2021-12-24 LAB — HIV ANTIBODY (ROUTINE TESTING W REFLEX): HIV 1&2 Ab, 4th Generation: NONREACTIVE

## 2021-12-24 LAB — HEPATITIS C ANTIBODY: Hepatitis C Ab: NONREACTIVE

## 2021-12-24 LAB — CBC
HCT: 36.1 % (ref 35.0–45.0)
Hemoglobin: 12.3 g/dL (ref 11.7–15.5)
MCH: 29.2 pg (ref 27.0–33.0)
MCHC: 34.1 g/dL (ref 32.0–36.0)
MCV: 85.7 fL (ref 80.0–100.0)
MPV: 10.5 fL (ref 7.5–12.5)
Platelets: 326 10*3/uL (ref 140–400)
RBC: 4.21 10*6/uL (ref 3.80–5.10)
RDW: 12.7 % (ref 11.0–15.0)
WBC: 6.8 10*3/uL (ref 3.8–10.8)

## 2021-12-24 LAB — LIPID PANEL
Cholesterol: 179 mg/dL (ref <200)
HDL: 45 mg/dL — ABNORMAL LOW (ref >=50)
LDL Cholesterol (Calc): 99 mg/dL (calc)
Non-HDL Cholesterol (Calc): 134 mg/dL (calc) — ABNORMAL HIGH (ref <130)
Total CHOL/HDL Ratio: 4 (calc) (ref <5.0)
Triglycerides: 234 mg/dL — ABNORMAL HIGH (ref <150)

## 2022-02-20 ENCOUNTER — Other Ambulatory Visit: Payer: Self-pay | Admitting: Internal Medicine

## 2022-02-23 NOTE — Telephone Encounter (Signed)
Requested Prescriptions  Pending Prescriptions Disp Refills   OZEMPIC, 2 MG/DOSE, 8 MG/3ML SOPN [Pharmacy Med Name: OZEMPIC 8 MG/3 ML (2 MG/DOSE)] 9 mL 0    Sig: INJECT 2 MG INTO THE SKIN ONCE A WEEK.     Endocrinology:  Diabetes - GLP-1 Receptor Agonists - semaglutide Passed - 02/20/2022  3:09 PM      Passed - HBA1C in normal range and within 180 days    Hgb A1c MFr Bld  Date Value Ref Range Status  12/23/2021 5.5 <5.7 % of total Hgb Final    Comment:    For the purpose of screening for the presence of diabetes: . <5.7%       Consistent with the absence of diabetes 5.7-6.4%    Consistent with increased risk for diabetes             (prediabetes) > or =6.5%  Consistent with diabetes . This assay result is consistent with a decreased risk of diabetes. . Currently, no consensus exists regarding use of hemoglobin A1c for diagnosis of diabetes in children. . According to American Diabetes Association (ADA) guidelines, hemoglobin A1c <7.0% represents optimal control in non-pregnant diabetic patients. Different metrics may apply to specific patient populations.  Standards of Medical Care in Diabetes(ADA). .          Passed - Cr in normal range and within 360 days    Creat  Date Value Ref Range Status  12/23/2021 0.66 0.50 - 1.03 mg/dL Final         Passed - Valid encounter within last 6 months    Recent Outpatient Visits           2 months ago Encounter for general adult medical examination with abnormal findings   [facility] [place], Coralie Keens, NP   4 months ago Other specified disorders of kidney and ureter   [facility] [place], Regina W, NP   10 months ago Overweight with body mass index (BMI) of 29 to 29.9 in adult   [place], [place] W, NP   1 year ago Class 1 obesity due to excess calories with serious comorbidity and body mass index (BMI) of 30.0 to 30.9 in adult   [place], Coralie Keens, NP    1 year ago Encounter for annual general medical examination with abnormal findings in adult   [facility] [place], Coralie Keens, [place]

## 2022-02-27 ENCOUNTER — Other Ambulatory Visit: Payer: Self-pay | Admitting: Internal Medicine

## 2022-03-02 NOTE — Telephone Encounter (Signed)
Requested medication (s) are due for refill today:   Yes  Requested medication (s) are on the active medication list:   Yes  Future visit scheduled:   No   Had physical recently   Last ordered: 02/23/2022 9 ml, 0 refills  Returned because this is on backorder/unavailable   Requested Prescriptions  Pending Prescriptions Disp Refills   OZEMPIC, 2 MG/DOSE, 8 MG/3ML SOPN [Pharmacy Med Name: OZEMPIC 8 MG/3 ML (2 MG/DOSE)]  0    Sig: INJECT 2 MG INTO THE SKIN ONCE A WEEK.     Endocrinology:  Diabetes - GLP-1 Receptor Agonists - semaglutide Passed - 02/27/2022  7:00 PM      Passed - HBA1C in normal range and within 180 days    Hgb A1c MFr Bld  Date Value Ref Range Status  12/23/2021 5.5 <5.7 % of total Hgb Final    Comment:    For the purpose of screening for the presence of diabetes: . <5.7%       Consistent with the absence of diabetes 5.7-6.4%    Consistent with increased risk for diabetes             (prediabetes) > or =6.5%  Consistent with diabetes . This assay result is consistent with a decreased risk of diabetes. . Currently, no consensus exists regarding use of hemoglobin A1c for diagnosis of diabetes in children. . According to American Diabetes Association (ADA) guidelines, hemoglobin A1c <7.0% represents optimal control in non-pregnant diabetic patients. Different metrics may apply to specific patient populations.  Standards of Medical Care in Diabetes(ADA). .          Passed - Cr in normal range and within 360 days    Creat  Date Value Ref Range Status  12/23/2021 0.66 0.50 - 1.03 mg/dL Final         Passed - Valid encounter within last 6 months    Recent Outpatient Visits           2 months ago Encounter for general adult medical examination with abnormal findings   [facility] [place], Coralie Keens, NP   4 months ago Other specified disorders of kidney and ureter   [facility] [place], Regina W, NP   10 months ago Overweight  with body mass index (BMI) of 29 to 29.9 in adult   [place], [place] W, NP   1 year ago Class 1 obesity due to excess calories with serious comorbidity and body mass index (BMI) of 30.0 to 30.9 in adult   [place], Coralie Keens, NP   1 year ago Encounter for annual general medical examination with abnormal findings in adult   [facility] [place], Coralie Keens, [place]

## 2022-03-10 ENCOUNTER — Other Ambulatory Visit: Payer: Self-pay | Admitting: Internal Medicine

## 2022-03-10 MED ORDER — CIPROFLOXACIN HCL 500 MG PO TABS: 500.0000 mg | ORAL_TABLET | Freq: Two times a day (BID) | ORAL | 0 refills | Status: AC

## 2022-03-10 MED ORDER — OZEMPIC (2 MG/DOSE) 8 MG/3ML ~~LOC~~ SOPN: 2.0000 mg | PEN_INJECTOR | SUBCUTANEOUS | 0 refills | Status: DC

## 2022-04-13 ENCOUNTER — Ambulatory Visit: Payer: Commercial Managed Care - PPO | Admitting: Internal Medicine

## 2022-04-13 ENCOUNTER — Encounter: Payer: Self-pay | Admitting: Internal Medicine

## 2022-04-13 VITALS — BP 106/63 | HR 92 | Temp 98.2°F | Wt 139.0 lb

## 2022-04-13 DIAGNOSIS — R6883 Chills (without fever): Secondary | ICD-10-CM | POA: Diagnosis not present

## 2022-04-13 DIAGNOSIS — J101 Influenza due to other identified influenza virus with other respiratory manifestations: Secondary | ICD-10-CM | POA: Diagnosis not present

## 2022-04-13 LAB — POCT INFLUENZA A/B
Influenza A, POC: POSITIVE — AB
Influenza B, POC: NEGATIVE

## 2022-04-13 LAB — POCT RAPID STREP A (OFFICE): Rapid Strep A Screen: NEGATIVE

## 2022-04-13 MED ORDER — OSELTAMIVIR PHOSPHATE 75 MG PO CAPS: 75.0000 mg | ORAL_CAPSULE | Freq: Two times a day (BID) | ORAL | 0 refills | Status: DC

## 2022-04-13 NOTE — Progress Notes (Signed)
Subjective:    Patient ID: Tracy Madden, female    DOB: 1971-02-17, 52 y.o.   MRN: [id number]  HPI  Patient presents to clinic today with complaint of fever, body aches, chills, sore throat and cough.  This started yesterday.  She denies difficulty swallowing.  The cough is mostly nonproductive.  She denies headache, runny nose, nasal congestion, ear pain, shortness of breath, nausea, vomiting or diarrhea.  She has tried Mucinex OTC with some relief of symptoms.  She has had sick contacts with similar symptoms.  Review of Systems     Past Medical History:  Diagnosis Date   Abdominal pain, LLQ    Abnormal uterine bleeding    Anemia    Cervical dysplasia    Endometriosis    Fibroid    Melanoma ([place])    RT SHOULDER   Paratubal cyst    Pneumonia    Prediabetes     Current Outpatient Medications  Medication Sig Dispense Refill   OZEMPIC, 2 MG/DOSE, 8 MG/3ML SOPN Inject 2 mg into the skin once a week. 9 mL 0   valACYclovir (VALTREX) 1000 MG tablet SMARTSIG:2 Tablet(s) By Mouth Every 12 Hours     VITAMIN D PO Take by mouth. As needed (Patient not taking: Reported on 12/23/2021)     No current facility-administered medications for this visit.    Allergies  Allergen Reactions   Eggs Or Egg-Derived Products Anaphylaxis   Niacin Hives, Rash and Other (See Comments)    Whelps in a matter of seconds    Family History  Problem Relation Age of Onset   Uterine cancer Mother     Social History   Socioeconomic History   Marital status: Married    Spouse name: Not on file   Number of children: Not on file   Years of education: Not on file   Highest education level: Not on file  Occupational History   Not on file  Tobacco Use   Smoking status: Never   Smokeless tobacco: Never  Vaping Use   Vaping Use: Never used  Substance and Sexual Activity   Alcohol use: Yes    Comment: OCCAS   Drug use: No   Sexual activity: Not Currently    Birth control/protection: Surgical   Other Topics Concern   Not on file  Social History Narrative   Not on file   Social Determinants of Health   Financial Resource Strain: Not on file  Food Insecurity: Not on file  Transportation Needs: Not on file  Physical Activity: Not on file  Stress: Not on file  Social Connections: Not on file  Intimate Partner Violence: Not on file     Constitutional: Patient reports fever, malaise.  Denies fatigue, headache or abrupt weight changes.  HEENT: Patient reports sore throat.  Denies eye pain, eye redness, ear pain, ringing in the ears, wax buildup, runny nose, nasal congestion, bloody nose. Respiratory: Patient reports cough.  Denies difficulty breathing, shortness of breath, or sputum production.   Cardiovascular: Denies chest pain, chest tightness, palpitations or swelling in the hands or feet.  Gastrointestinal: Denies abdominal pain, bloating, constipation, diarrhea or blood in the stool.   No other specific complaints in a complete review of systems (except as listed in HPI above).  Objective:   Physical Exam  BP 106/63 (BP Location: Left Arm, Patient Position: Sitting, Cuff Size: Normal)   Pulse 92   Temp 98.2 F (36.8 C) (Temporal)   Wt 139 lb (63 kg)  LMP  (LMP Unknown)   SpO2 98%   BMI 25.42 kg/m   Wt Readings from Last 3 Encounters:  12/23/21 139 lb 12.8 oz (63.4 kg)  10/16/21 139 lb (63 kg)  04/10/21 161 lb 6.4 oz (73.2 kg)    General: Appears her stated age, well developed, well nourished in NAD. Skin: Warm, dry and intact. No rashes noted. HEENT: Head: normal shape and size; Eyes: sclera white, no icterus, conjunctiva pink, PERRLA and EOMs intact; Throat/Mouth: Teeth present, mucosa erythematous and moist, no exudate, lesions or ulcerations noted.  Neck: No adenopathy noted. Cardiovascular: Normal rate and rhythm. S1,S2 noted.  No murmur, rubs or gallops noted.  Pulmonary/Chest: Normal effort and positive vesicular breath sounds. No respiratory  distress. No wheezes, rales or ronchi noted.  Neurological: Alert and oriented.   BMET    Component Value Date/Time   NA 139 12/23/2021 1453   NA 143 10/12/2018 1519   K 4.0 12/23/2021 1453   CL 103 12/23/2021 1453   CO2 28 12/23/2021 1453   GLUCOSE 90 12/23/2021 1453   BUN 16 12/23/2021 1453   BUN 13 10/12/2018 1519   CREATININE 0.66 12/23/2021 1453   CALCIUM 9.6 12/23/2021 1453   GFRNONAA 85 10/12/2018 1519   GFRAA 98 10/12/2018 1519    Lipid Panel     Component Value Date/Time   CHOL 179 12/23/2021 1453   CHOL 182 10/12/2018 1519   TRIG 234 (H) 12/23/2021 1453   HDL 45 (L) 12/23/2021 1453   HDL 41 10/12/2018 1519   CHOLHDL 4.0 12/23/2021 1453   LDLCALC 99 12/23/2021 1453    CBC    Component Value Date/Time   WBC 6.8 12/23/2021 1453   RBC 4.21 12/23/2021 1453   HGB 12.3 12/23/2021 1453   HGB 13.4 10/12/2018 1519   HCT 36.1 12/23/2021 1453   HCT 39.6 10/12/2018 1519   PLT 326 12/23/2021 1453   PLT 315 10/12/2018 1519   MCV 85.7 12/23/2021 1453   MCV 85 10/12/2018 1519   MCH 29.2 12/23/2021 1453   MCHC 34.1 12/23/2021 1453   RDW 12.7 12/23/2021 1453   RDW 12.7 10/12/2018 1519   LYMPHSABS 1.8 03/12/2016 1041   MONOABS 0.4 03/12/2016 1041   EOSABS 0.1 03/12/2016 1041   BASOSABS 0.0 03/12/2016 1041    Hgb A1C Lab Results  Component Value Date   HGBA1C 5.5 12/23/2021            Assessment & Plan:   Chills, Influenza A:  Rapid strep negative Rapid flu positive Encourage rest and fluids Rx for Tamiflu 75 mg twice daily Continue Mucinex OTC as needed for cough Work note provided   RTC in 2 months for follow-up of chronic conditions Webb Silversmith, NP

## 2022-04-13 NOTE — Patient Instructions (Signed)

## 2022-05-20 ENCOUNTER — Ambulatory Visit: Payer: Self-pay | Admitting: *Deleted

## 2022-05-20 NOTE — Telephone Encounter (Signed)
  Chief Complaint: left arm pain  Symptoms: left arm pain some swelling inner arm bicep. Weakness noted holding cup for extended time. Taking tylenol or motrin with some relief but not working well anymore.  Frequency: months  Pertinent Negatives: Patient denies difficulty breathing no fever no rash no N/T . No warmth to area. Can sleep if positioned well.  Disposition: []$ ED /[]$ Urgent Care (no appt availability in office) / [x]$ Appointment(In office/virtual)/ []$  [facility] [facility]/ []$ Home Care/ []$ Refused Recommended Disposition /[]$ [facility]/ []$  Follow-up with PCP Additional Notes:   Appt scheduled 05/22/22. Recommended if sx worsen go to ED.    Reason for Disposition  [1] MODERATE pain (e.g., interferes with normal activities) AND [2] present > 3 days  Answer Assessment - Initial Assessment Questions 1. ONSET: "When did the pain start?"     Months  2. LOCATION: "Where is the pain located?"     Left arm inner bicep radiate to wrist  3. PAIN: "How bad is the pain?" (Scale 1-10; or mild, moderate, severe)   - MILD (1-3): Doesn't interfere with normal activities.   - MODERATE (4-7): Interferes with normal activities (e.g., work or school) or awakens from sleep.   - SEVERE (8-10): Excruciating pain, unable to do any normal activities, unable to hold a cup of water.     Level 5. Able to sleep but has to position arm for comfort , can hold cup of water but noticed weakness when holding longer  4. WORK OR EXERCISE: "Has there been any recent work or exercise that involved this part of the body?"     na 5. CAUSE: "What do you think is causing the arm pain?"     Not sure questioned "blood clot" 6. OTHER SYMPTOMS: "Do you have any other symptoms?" (e.g., neck pain, swelling, rash, fever, numbness, weakness)     Swelling upper arm at bicep area, no redness no warm to touch, denies N/T. Weakness after holding cup  7. PREGNANCY: "Is there any chance you are pregnant?" "When was your  last menstrual period?"     na  Protocols used: Arm Pain-A-AH

## 2022-05-22 ENCOUNTER — Ambulatory Visit: Payer: Commercial Managed Care - PPO | Admitting: Internal Medicine

## 2022-05-22 ENCOUNTER — Encounter: Payer: Self-pay | Admitting: Internal Medicine

## 2022-05-22 VITALS — BP 106/68 | HR 82 | Temp 96.7°F | Wt 140.0 lb

## 2022-05-22 DIAGNOSIS — R3915 Urgency of urination: Secondary | ICD-10-CM | POA: Diagnosis not present

## 2022-05-22 DIAGNOSIS — M25512 Pain in left shoulder: Secondary | ICD-10-CM | POA: Diagnosis not present

## 2022-05-22 DIAGNOSIS — R350 Frequency of micturition: Secondary | ICD-10-CM | POA: Diagnosis not present

## 2022-05-22 DIAGNOSIS — R300 Dysuria: Secondary | ICD-10-CM | POA: Diagnosis not present

## 2022-05-22 DIAGNOSIS — G8929 Other chronic pain: Secondary | ICD-10-CM

## 2022-05-22 MED ORDER — NITROFURANTOIN MONOHYD MACRO 100 MG PO CAPS: 100.0000 mg | ORAL_CAPSULE | Freq: Two times a day (BID) | ORAL | 0 refills | Status: DC

## 2022-05-22 MED ORDER — PREDNISONE 20 MG PO TABS: ORAL_TABLET | ORAL | 0 refills | Status: AC

## 2022-05-22 NOTE — Patient Instructions (Signed)
Tendinitis  Tendinitis is irritation and swelling (inflammation) of a tendon. A tendon is a cord of tissue that connects muscle to bone. Tendinitis is most common in the shoulder, ankle, elbow, or wrist. What are the causes? Using a tendon or muscle too much (overuse). This is the most common cause. Wear and tear that happens as you age. Injury. Some medical conditions, such as arthritis. Some medicines. What increases the risk? You are more likely to get this condition if you do activities that involve the same movements over and over again (repetitive motions). What are the signs or symptoms? Pain. Tenderness. Mild swelling. Decreased range of motion. How is this treated? This condition is usually treated with RICE therapy. RICE stands for: Rest. Ice. Compression. This means putting pressure on the affected area. Elevation. This means raising the affected area above the level of your heart. Treatment may also include: Medicines for swelling or pain. Exercises or physical therapy to help your tendon move better and get stronger. A brace or splint. A shot (injection) of a type of medicine called corticosteroid. Surgery. This is rarely needed. Follow these instructions at home: If you have a splint or brace that can be taken off: Wear the splint or brace as told by your doctor. Take it off only as told by your doctor. Check the skin around the splint or brace every day. Tell your doctor if you see problems. Loosen the splint or brace if your fingers or toes: Tingle. Become numb. Turn cold and blue. Keep the splint or brace clean. If the splint or brace is not waterproof: Do not let it get wet. Cover it with a watertight covering when you take a bath or shower, or take it off as told by your doctor. Managing pain, stiffness, and swelling     If told, put ice on the affected area. To do this: If you have a removable splint or brace, take it off as told by your doctor. Put  ice in a plastic bag. Place a towel between your skin and the bag. Leave the ice on for 20 minutes, 2-3 times a day. Take off the ice if your skin turns bright red. This is very important. If you cannot feel pain, heat, or cold, you have a greater risk of damage to the area. Move the fingers or toes of the affected arm or leg often, if this applies. If told, raise the affected area above the level of your heart while you are sitting or lying down. If told, put heat on the affected area before you exercise. Use the heat source that your doctor recommends, such as a moist heat pack or a heating pad. Place a towel between your skin and the heat source. Leave the heat on for 20-30 minutes. Take off the heat if your skin turns bright red. This is very important. If you cannot feel pain, heat, or cold, you have a greater risk of getting burned. Activity Rest the affected area as told by your doctor. Ask your doctor when it is safe to drive if you have a splint or brace on any part of your arm or leg. Return to your normal activities when your doctor says that it is safe. Avoid using the affected area while you have symptoms. Do exercises as told by your doctor. General instructions Wear an elastic bandage or pressure (compression) wrap only as told by your doctor. Take over-the-counter and prescription medicines only as told by your doctor. Keep all follow-up  visits. Contact a doctor if: You do not get better. You get new problems, such as numbness in your hands or feet, and you do not know why. Summary Tendinitis is irritation and swelling (inflammation) of a tendon. You are more likely to get this condition if you do activities that involve the same movements over and over again. This condition is usually treated with RICE therapy. RICE stands for rest, ice, compression, and elevate. Avoid using the affected area while you have symptoms. This information is not intended to replace advice  given to you by your health care provider. Make sure you discuss any questions you have with your health care provider. Document Revised: 11/21/2020 Document Reviewed: 11/21/2020 Elsevier Patient Education  [place].

## 2022-05-22 NOTE — Progress Notes (Signed)
Subjective:    Patient ID: Tracy Madden, female    DOB: [birth year], 52 y.o.   MRN: [id number]  HPI  Patient presents to clinic today with complaint of left upper arm pain and swelling.  This started several months ago.  She describes the pain as sore and achy. The pain is worth with movement, pulling/lifting or laying on the left side. She reports some associated numbness, denies tingling. She has not had any injury to the area.  She has tried Ibuprofen OTC with minimal relief of symptoms  She also reports urinary urgency, frequency and dysuria.  This started within the last few days.  She denies burning with urination, blood in urine, vaginal symptoms, pelvic pain, fever, chills, nausea or vomiting.  She has not tried anything OTC for this.  Review of Systems     Past Medical History:  Diagnosis Date   Abdominal pain, LLQ    Abnormal uterine bleeding    Anemia    Cervical dysplasia    Endometriosis    Fibroid    Melanoma ([place])    RT SHOULDER   Paratubal cyst    Pneumonia    Prediabetes     Current Outpatient Medications  Medication Sig Dispense Refill   oseltamivir (TAMIFLU) 75 MG capsule Take 1 capsule (75 mg total) by mouth 2 (two) times daily. For 5 days 10 capsule 0   OZEMPIC, 2 MG/DOSE, 8 MG/3ML SOPN Inject 2 mg into the skin once a week. 9 mL 0   valACYclovir (VALTREX) 1000 MG tablet SMARTSIG:2 Tablet(s) By Mouth Every 12 Hours     VITAMIN D PO Take by mouth. As needed     No current facility-administered medications for this visit.    Allergies  Allergen Reactions   Eggs Or Egg-Derived Products Anaphylaxis   Niacin Hives, Rash and Other (See Comments)    Whelps in a matter of seconds    Family History  Problem Relation Age of Onset   Uterine cancer Mother     Social History   Socioeconomic History   Marital status: Married    Spouse name: Not on file   Number of children: Not on file   Years of education: Not on file   Highest education level:  Not on file  Occupational History   Not on file  Tobacco Use   Smoking status: Never   Smokeless tobacco: Never  Vaping Use   Vaping Use: Never used  Substance and Sexual Activity   Alcohol use: Yes    Comment: OCCAS   Drug use: No   Sexual activity: Not Currently    Birth control/protection: Surgical  Other Topics Concern   Not on file  Social History Narrative   Not on file   Social Determinants of Health   Financial Resource Strain: Not on file  Food Insecurity: Not on file  Transportation Needs: Not on file  Physical Activity: Not on file  Stress: Not on file  Social Connections: Not on file  Intimate Partner Violence: Not on file     Constitutional: Denies fever, malaise, fatigue, headache or abrupt weight changes.  Respiratory: Denies difficulty breathing, shortness of breath, cough or sputum production.   Cardiovascular: Denies chest pain, chest tightness, palpitations or swelling in the hands or feet.  Gastrointestinal: Denies abdominal pain, bloating, constipation, diarrhea or blood in the stool.  GU: Patient reports urinary urgency, frequency and dysuria.  Denies burning sensation, blood in urine, odor or discharge. Musculoskeletal: Patient reports left  upper arm pain and swelling.  Denies decrease in range of motion, difficulty with gait, muscle pain.  Skin: Denies redness, rashes, lesions or ulcercations.  Neurological: Patient reports numbness of left upper extremity.  Denies dizziness, difficulty   No other specific complaints in a complete review of systems (except as listed in HPI above).  Objective:   Physical Exam  BP 106/68 (BP Location: Right Arm, Patient Position: Sitting, Cuff Size: Normal)   Pulse 82   Temp (!) 96.7 F (35.9 C) (Temporal)   Wt 140 lb (63.5 kg)   LMP  (LMP Unknown)   SpO2 99%   BMI 25.61 kg/m   Wt Readings from Last 3 Encounters:  04/13/22 139 lb (63 kg)  12/23/21 139 lb 12.8 oz (63.4 kg)  10/16/21 139 lb (63 kg)     General: Appears her stated age, overweight, ished in NAD. Cardiovascular: Normal rate and rhythm. S1,S2 noted.  No murmur, rubs or gallops noted.  Pulmonary/Chest: Normal effort and positive vesicular breath sounds. No respiratory distress. No wheezes, rales or ronchi noted.  Musculoskeletal: Normal internal and external rotation of the left shoulder.  No pain with palpation of the left shoulder.  Shoulder shrug equal.  Positive drop can test on the left.  Strength 5/5 BUE.  Handgrips equal Neurological: Alert and oriented.  Coordination normal.    BMET    Component Value Date/Time   NA 139 12/23/2021 1453   NA 143 10/12/2018 1519   K 4.0 12/23/2021 1453   CL 103 12/23/2021 1453   CO2 28 12/23/2021 1453   GLUCOSE 90 12/23/2021 1453   BUN 16 12/23/2021 1453   BUN 13 10/12/2018 1519   CREATININE 0.66 12/23/2021 1453   CALCIUM 9.6 12/23/2021 1453   GFRNONAA 85 10/12/2018 1519   GFRAA 98 10/12/2018 1519    Lipid Panel     Component Value Date/Time   CHOL 179 12/23/2021 1453   CHOL 182 10/12/2018 1519   TRIG 234 (H) 12/23/2021 1453   HDL 45 (L) 12/23/2021 1453   HDL 41 10/12/2018 1519   CHOLHDL 4.0 12/23/2021 1453   LDLCALC 99 12/23/2021 1453    CBC    Component Value Date/Time   WBC 6.8 12/23/2021 1453   RBC 4.21 12/23/2021 1453   HGB 12.3 12/23/2021 1453   HGB 13.4 10/12/2018 1519   HCT 36.1 12/23/2021 1453   HCT 39.6 10/12/2018 1519   PLT 326 12/23/2021 1453   PLT 315 10/12/2018 1519   MCV 85.7 12/23/2021 1453   MCV 85 10/12/2018 1519   MCH 29.2 12/23/2021 1453   MCHC 34.1 12/23/2021 1453   RDW 12.7 12/23/2021 1453   RDW 12.7 10/12/2018 1519   LYMPHSABS 1.8 03/12/2016 1041   MONOABS 0.4 03/12/2016 1041   EOSABS 0.1 03/12/2016 1041   BASOSABS 0.0 03/12/2016 1041    Hgb A1C Lab Results  Component Value Date   HGBA1C 5.5 12/23/2021           Assessment & Plan:   Left Shoulder Pain, Numbness of LUE:  Concern for tendinitis of the biceps  tendon versus rotator cuff Rx for Prednisone 20 mg x 5 days, 10 mg x 5 days Avoid overuse Encouraged ice for 10 minutes 3 times daily Discussed further evaluation with x-ray and possible physical therapy  Urinary Urgency, Frequency, Dysuria:  Unable to provide urine sample Push fluids Rx for Macrobid 100 mg twice daily x 5 days RTC in 7 months for annual exam Webb Silversmith, NP

## 2022-07-27 ENCOUNTER — Ambulatory Visit (LOCAL_COMMUNITY_HEALTH_CENTER): Payer: Commercial Managed Care - PPO

## 2022-07-27 DIAGNOSIS — Z111 Encounter for screening for respiratory tuberculosis: Secondary | ICD-10-CM | POA: Diagnosis not present

## 2022-07-27 DIAGNOSIS — Z719 Counseling, unspecified: Secondary | ICD-10-CM

## 2022-07-27 DIAGNOSIS — Z23 Encounter for immunization: Secondary | ICD-10-CM

## 2022-07-27 NOTE — Progress Notes (Signed)
In nurse clinic for immunizations and QFT testing done for school. Tolerated vaccines well. VIS statements given and copy of [facility] given to pt.

## 2022-07-29 ENCOUNTER — Telehealth: Payer: Self-pay | Admitting: Family Medicine

## 2022-07-29 NOTE — Telephone Encounter (Signed)
Please give me a call back to discuss my test results  

## 2022-07-29 NOTE — Telephone Encounter (Signed)
Received msg from pt regarding when would be able to get QFT that were drawn on 07/27/22.Advised pt that results should be back 7-10days and will be contacted if abnormal results/results will be in mychart.

## 2022-07-30 ENCOUNTER — Encounter: Payer: Self-pay | Admitting: Internal Medicine

## 2022-07-30 LAB — QUANTIFERON-TB GOLD PLUS
QuantiFERON Mitogen Value: >10 IU/mL
QuantiFERON Nil Value: 0.02 IU/mL
QuantiFERON TB1 Ag Value: 0.01 IU/mL
QuantiFERON TB2 Ag Value: 0.02 IU/mL
QuantiFERON-TB Gold Plus: NEGATIVE

## 2022-08-04 ENCOUNTER — Encounter: Payer: Self-pay | Admitting: Internal Medicine

## 2022-08-04 ENCOUNTER — Ambulatory Visit: Payer: Commercial Managed Care - PPO | Admitting: Internal Medicine

## 2022-08-04 VITALS — BP 108/64 | HR 97 | Temp 96.8°F | Ht 62.0 in | Wt 144.0 lb

## 2022-08-04 DIAGNOSIS — E663 Overweight: Secondary | ICD-10-CM

## 2022-08-04 DIAGNOSIS — Z0289 Encounter for other administrative examinations: Secondary | ICD-10-CM | POA: Diagnosis not present

## 2022-08-04 DIAGNOSIS — Z6826 Body mass index (BMI) 26.0-26.9, adult: Secondary | ICD-10-CM

## 2022-08-04 DIAGNOSIS — R300 Dysuria: Secondary | ICD-10-CM

## 2022-08-04 DIAGNOSIS — R7303 Prediabetes: Secondary | ICD-10-CM

## 2022-08-04 DIAGNOSIS — N390 Urinary tract infection, site not specified: Secondary | ICD-10-CM

## 2022-08-04 DIAGNOSIS — E781 Pure hyperglyceridemia: Secondary | ICD-10-CM

## 2022-08-04 LAB — POCT URINALYSIS DIPSTICK
Bilirubin, UA: NEGATIVE
Glucose, UA: NEGATIVE
Ketones, UA: NEGATIVE
Nitrite, UA: NEGATIVE
Protein, UA: NEGATIVE
Spec Grav, UA: 1.025 (ref 1.010–1.025)
Urobilinogen, UA: 0.2 E.U./dL
pH, UA: 6 (ref 5.0–8.0)

## 2022-08-04 LAB — CBC
HCT: 40.8 % (ref 35.0–45.0)
MPV: 10.4 fL (ref 7.5–12.5)
Platelets: 313 10*3/uL (ref 140–400)
RDW: 12.5 % (ref 11.0–15.0)

## 2022-08-04 MED ORDER — SULFAMETHOXAZOLE-TRIMETHOPRIM 400-80 MG PO TABS: 1.0000 | ORAL_TABLET | Freq: Two times a day (BID) | ORAL | 0 refills | Status: DC

## 2022-08-04 NOTE — Assessment & Plan Note (Signed)
Encouraged diet and exercise for weight loss ?

## 2022-08-04 NOTE — Assessment & Plan Note (Signed)
A1C today Encouraged low carb diet and exercise for weight loss  

## 2022-08-04 NOTE — Patient Instructions (Signed)

## 2022-08-04 NOTE — Progress Notes (Signed)
Subjective:    Patient ID: Tracy Madden, female    DOB: January 08, 1971, 52 y.o.   MRN: [id number]  HPI  Patient presents to clinic today for follow-up of chronic conditions.    HLD: Her last LDL was 99, triglycerides 086, 11/2021.  She is not taking any cholesterol-lowering medication at this time.  She tries to consume low-fat diet.  Prediabetes: Her last A1c was 5.5%, 11/2021.  She is taking Ozempic as prescribed.  She does not check her sugars.  She also request form completion.  She has admission physical form that she needs filled out.  Review of Systems     Past Medical History:  Diagnosis Date   Abdominal pain, LLQ    Abnormal uterine bleeding    Anemia    Cervical dysplasia    Endometriosis    Fibroid    Melanoma ([facility])    RT SHOULDER   Paratubal cyst    Pneumonia    Prediabetes     Current Outpatient Medications  Medication Sig Dispense Refill   nitrofurantoin, macrocrystal-monohydrate, (MACROBID) 100 MG capsule Take 1 capsule (100 mg total) by mouth 2 (two) times daily. 10 capsule 0   OZEMPIC, 2 MG/DOSE, 8 MG/3ML SOPN Inject 2 mg into the skin once a week. 9 mL 0   valACYclovir (VALTREX) 1000 MG tablet SMARTSIG:2 Tablet(s) By Mouth Every 12 Hours     VITAMIN D PO Take by mouth. As needed     No current facility-administered medications for this visit.    Allergies  Allergen Reactions   Egg-Derived Products Anaphylaxis   Niacin Hives, Rash and Other (See Comments)    Whelps in a matter of seconds    Family History  Problem Relation Age of Onset   Uterine cancer Mother     Social History   Socioeconomic History   Marital status: Married    Spouse name: Not on file   Number of children: Not on file   Years of education: Not on file   Highest education level: Not on file  Occupational History   Not on file  Tobacco Use   Smoking status: Never   Smokeless tobacco: Never  Vaping Use   Vaping Use: Never used  Substance and Sexual Activity    Alcohol use: Yes    Comment: OCCAS   Drug use: No   Sexual activity: Not Currently    Birth control/protection: Surgical  Other Topics Concern   Not on file  Social History Narrative   Not on file   Social Determinants of Health   Financial Resource Strain: Not on file  Food Insecurity: Not on file  Transportation Needs: Not on file  Physical Activity: Not on file  Stress: Not on file  Social Connections: Not on file  Intimate Partner Violence: Not on file     Constitutional: Denies fever, malaise, fatigue, headache or abrupt weight changes.  HEENT: Denies eye pain, eye redness, ear pain, ringing in the ears, wax buildup, runny nose, nasal congestion, bloody nose, or sore throat. Respiratory: Denies difficulty breathing, shortness of breath, cough or sputum production.   Cardiovascular: Denies chest pain, chest tightness, palpitations or swelling in the hands or feet.  Gastrointestinal: Denies abdominal pain, bloating, constipation, diarrhea or blood in the stool.  GU: Pt reports dysuria, recurrent UTI's. Denies urgency, frequency, pain with urination, burning sensation, blood in urine, odor or discharge. Musculoskeletal: Denies decrease in range of motion, difficulty with gait, muscle pain or joint pain and swelling.  Skin: Denies redness, rashes, lesions or ulcercations.  Neurological: Denies dizziness, difficulty with memory, difficulty with speech or problems with balance and coordination.  Psych: Denies anxiety, depression, SI/HI.  No other specific complaints in a complete review of systems (except as listed in HPI above).  Objective:   Physical Exam   BP 108/64 (BP Location: Left Arm, Patient Position: Sitting, Cuff Size: Normal)   Pulse 97   Temp (!) 96.8 F (36 C) (Temporal)   Ht 5\' 2"  (1.575 m)   Wt 144 lb (65.3 kg)   LMP  (LMP Unknown)   SpO2 95%   BMI 26.34 kg/m   Wt Readings from Last 3 Encounters:  05/22/22 140 lb (63.5 kg)  04/13/22 139 lb (63 kg)   12/23/21 139 lb 12.8 oz (63.4 kg)    General: Appears her stated age, overweight,  in NAD. Skin: Warm, dry and intact.  HEENT: Head: normal shape and size; Eyes: sclera white, no icterus, conjunctiva pink, PERRLA and EOMs intact;  Cardiovascular: Normal rate and rhythm. S1,S2 noted.  No murmur, rubs or gallops noted. No JVD or BLE edema. No carotid bruits noted. Pulmonary/Chest: Normal effort and positive vesicular breath sounds. No respiratory distress. No wheezes, rales or ronchi noted.  Musculoskeletal:  No difficulty with gait.  Neurological: Alert and oriented. Cranial nerves II-XII grossly intact. Coordination normal.  Psychiatric: Mood and affect normal. Behavior is normal. Judgment and thought content normal.    BMET    Component Value Date/Time   NA 139 12/23/2021 1453   NA 143 10/12/2018 1519   K 4.0 12/23/2021 1453   CL 103 12/23/2021 1453   CO2 28 12/23/2021 1453   GLUCOSE 90 12/23/2021 1453   BUN 16 12/23/2021 1453   BUN 13 10/12/2018 1519   CREATININE 0.66 12/23/2021 1453   CALCIUM 9.6 12/23/2021 1453   GFRNONAA 85 10/12/2018 1519   GFRAA 98 10/12/2018 1519    Lipid Panel     Component Value Date/Time   CHOL 179 12/23/2021 1453   CHOL 182 10/12/2018 1519   TRIG 234 (H) 12/23/2021 1453   HDL 45 (L) 12/23/2021 1453   HDL 41 10/12/2018 1519   CHOLHDL 4.0 12/23/2021 1453   LDLCALC 99 12/23/2021 1453    CBC    Component Value Date/Time   WBC 6.8 12/23/2021 1453   RBC 4.21 12/23/2021 1453   HGB 12.3 12/23/2021 1453   HGB 13.4 10/12/2018 1519   HCT 36.1 12/23/2021 1453   HCT 39.6 10/12/2018 1519   PLT 326 12/23/2021 1453   PLT 315 10/12/2018 1519   MCV 85.7 12/23/2021 1453   MCV 85 10/12/2018 1519   MCH 29.2 12/23/2021 1453   MCHC 34.1 12/23/2021 1453   RDW 12.7 12/23/2021 1453   RDW 12.7 10/12/2018 1519   LYMPHSABS 1.8 03/12/2016 1041   MONOABS 0.4 03/12/2016 1041   EOSABS 0.1 03/12/2016 1041   BASOSABS 0.0 03/12/2016 1041    Hgb A1C Lab  Results  Component Value Date   HGBA1C 5.5 12/23/2021           Assessment & Plan:    Encounter for Form Completion with Patient:  Form will be completed once labs are resulted and faxed back  Dysuria, Recurrent UTI:  Urinalysis trace lueks, moderate blood We will send urine culture Push fluids  RTC in 4 months for annual exam Nicki Reaper, NP

## 2022-08-04 NOTE — Assessment & Plan Note (Signed)
CMET and lipid profile today Encouraged her to consume a low fat diet 

## 2022-08-05 ENCOUNTER — Encounter: Payer: Self-pay | Admitting: Internal Medicine

## 2022-08-05 LAB — COMPLETE METABOLIC PANEL WITH GFR
AG Ratio: 2 (calc) (ref 1.0–2.5)
AST: 16 U/L (ref 10–35)
Calcium: 9.9 mg/dL (ref 8.6–10.4)
Total Bilirubin: 0.3 mg/dL (ref 0.2–1.2)

## 2022-08-05 LAB — HEMOGLOBIN A1C: Mean Plasma Glucose: 117 mg/dL

## 2022-08-10 NOTE — Telephone Encounter (Signed)
This form has been faxed and already sent to scan.  It has not been uploaded in her chart under the media tab yet so I am unable to provide her with a copy at this time.

## 2022-08-10 NOTE — Telephone Encounter (Signed)
Copied from CRM [id number]. Topic: General - Other >> Aug 10, 2022 11:28 AM Tracy Madden wrote: Reason for CRM: The patient has called to follow up on the status of a form needed for their college physical   Please contact the patient further when possible

## 2022-08-12 LAB — URINE CULTURE
MICRO NUMBER:: 14940890
SPECIMEN QUALITY:: ADEQUATE

## 2022-08-12 LAB — COMPLETE METABOLIC PANEL WITH GFR
CO2: 30 mmol/L (ref 20–32)
Chloride: 104 mmol/L (ref 98–110)
Creat: 0.68 mg/dL (ref 0.50–1.03)
Globulin: 2.2 g/dL (calc) (ref 1.9–3.7)
Glucose, Bld: 98 mg/dL (ref 65–139)
Sodium: 142 mmol/L (ref 135–146)

## 2022-08-12 LAB — LIPID PANEL: Non-HDL Cholesterol (Calc): 143 mg/dL (calc) — ABNORMAL HIGH (ref <130)

## 2022-08-12 LAB — CBC
MCHC: 33.3 g/dL (ref 32.0–36.0)
WBC: 7.7 10*3/uL (ref 3.8–10.8)

## 2022-08-12 MED ORDER — SULFAMETHOXAZOLE-TRIMETHOPRIM 400-80 MG PO TABS: 1.0000 | ORAL_TABLET | Freq: Two times a day (BID) | ORAL | 0 refills | Status: DC

## 2022-08-12 NOTE — Addendum Note (Signed)
Addended by: Lorre Munroe on: 08/12/2022 09:33 AM   Modules accepted: Orders

## 2022-08-13 ENCOUNTER — Other Ambulatory Visit: Payer: Self-pay | Admitting: Internal Medicine

## 2022-08-13 LAB — COMPLETE METABOLIC PANEL WITH GFR
ALT: 18 U/L (ref 6–29)
Albumin: 4.5 g/dL (ref 3.6–5.1)
Alkaline phosphatase (APISO): 80 U/L (ref 37–153)
BUN: 18 mg/dL (ref 7–25)
Potassium: 4.4 mmol/L (ref 3.5–5.3)
Total Protein: 6.7 g/dL (ref 6.1–8.1)
eGFR: 105 mL/min/{1.73_m2} (ref >=60)

## 2022-08-13 LAB — HEMOGLOBIN A1C
Hgb A1c MFr Bld: 5.7 % of total Hgb — ABNORMAL HIGH (ref <5.7)
eAG (mmol/L): 6.5 mmol/L

## 2022-08-13 LAB — URINE CULTURE

## 2022-08-13 LAB — LIPID PANEL
Cholesterol: 199 mg/dL (ref <200)
HDL: 56 mg/dL (ref >=50)
LDL Cholesterol (Calc): 114 mg/dL (calc) — ABNORMAL HIGH
Total CHOL/HDL Ratio: 3.6 (calc) (ref <5.0)
Triglycerides: 172 mg/dL — ABNORMAL HIGH (ref <150)

## 2022-08-13 LAB — CBC
Hemoglobin: 13.6 g/dL (ref 11.7–15.5)
MCH: 28.5 pg (ref 27.0–33.0)
MCV: 85.5 fL (ref 80.0–100.0)
RBC: 4.77 10*6/uL (ref 3.80–5.10)

## 2022-08-13 NOTE — Telephone Encounter (Signed)
Requested Prescriptions  Pending Prescriptions Disp Refills   OZEMPIC, 2 MG/DOSE, 8 MG/3ML SOPN [Pharmacy Med Name: OZEMPIC 8 MG/3 ML (2 MG/DOSE)] 9 mL 0    Sig: INJECT 2 MG INTO THE SKIN ONCE A WEEK.     Endocrinology:  Diabetes - GLP-1 Receptor Agonists - semaglutide Failed - 08/13/2022 10:48 AM      Failed - HBA1C in normal range and within 180 days    Hgb A1c MFr Bld  Date Value Ref Range Status  08/04/2022 5.7 (H) <5.7 % of total Hgb Final    Comment:    For someone without known diabetes, a hemoglobin  A1c value between 5.7% and 6.4% is consistent with prediabetes and should be confirmed with a  follow-up test. . For someone with known diabetes, a value <7% indicates that their diabetes is well controlled. A1c targets should be individualized based on duration of diabetes, age, comorbid conditions, and other considerations. . This assay result is consistent with an increased risk of diabetes. . Currently, no consensus exists regarding use of hemoglobin A1c for diagnosis of diabetes for children. .          Passed - Cr in normal range and within 360 days    Creat  Date Value Ref Range Status  08/04/2022 0.68 0.50 - 1.03 mg/dL Final         Passed - Valid encounter within last 6 months    Recent Outpatient Visits           1 week ago Prediabetes   [place] [facility] [place], [place] W, NP   2 months ago Chronic left shoulder pain   [place] [facility] [place], Salvadore Oxford, NP   4 months ago Influenza A   [place] [facility] [place], Salvadore Oxford, NP   7 months ago Encounter for general adult medical examination with abnormal findings   [place] [facility] [place], Salvadore Oxford, NP   10 months ago Other specified disorders of kidney and ureter   [place] [facility] [place], Salvadore Oxford, NP       Future Appointments             In 4 months Baity, Salvadore Oxford, NP  [facility], [facility]

## 2022-08-17 ENCOUNTER — Other Ambulatory Visit: Payer: Self-pay | Admitting: Internal Medicine

## 2022-08-17 MED ORDER — AMOXICILLIN-POT CLAVULANATE 875-125 MG PO TABS: 1.0000 | ORAL_TABLET | Freq: Two times a day (BID) | ORAL | 0 refills | Status: DC

## 2022-10-02 ENCOUNTER — Telehealth: Payer: Self-pay

## 2022-10-02 NOTE — Telephone Encounter (Signed)
Clerk scheduled patient for immunization appt 10/08/2022. The patient had questions regarding Tdap and the [facility] asked RN to review vaccine record and contact patient to notify her if next Tdap is due.   RN placed phone call to patient. Upon [facility] review, next Tdap is due 01/26/2023 and this was explained to patient. Patient states she plans to keep imm appt on 10/08/22 and wants to complete polio series. Jerel Shepherd, RN

## 2022-10-08 ENCOUNTER — Ambulatory Visit: Payer: Commercial Managed Care - PPO

## 2022-10-16 ENCOUNTER — Ambulatory Visit: Payer: Commercial Managed Care - PPO

## 2022-10-26 ENCOUNTER — Ambulatory Visit (LOCAL_COMMUNITY_HEALTH_CENTER): Payer: Commercial Managed Care - PPO

## 2022-10-26 DIAGNOSIS — Z719 Counseling, unspecified: Secondary | ICD-10-CM

## 2022-10-26 DIAGNOSIS — Z23 Encounter for immunization: Secondary | ICD-10-CM

## 2022-10-26 NOTE — Progress Notes (Signed)
In nurse clinic today for immunization. Poli vaccine. Tolerated well to L delt. VIS given. [facility]IR updated and copy given to patient.

## 2022-12-25 ENCOUNTER — Ambulatory Visit (INDEPENDENT_AMBULATORY_CARE_PROVIDER_SITE_OTHER): Payer: Commercial Managed Care - PPO | Admitting: Internal Medicine

## 2022-12-25 ENCOUNTER — Encounter: Payer: Self-pay | Admitting: Internal Medicine

## 2022-12-25 ENCOUNTER — Other Ambulatory Visit: Payer: Self-pay | Admitting: Internal Medicine

## 2022-12-25 VITALS — BP 106/64 | HR 82 | Temp 95.7°F | Ht 63.0 in | Wt 147.0 lb

## 2022-12-25 DIAGNOSIS — N390 Urinary tract infection, site not specified: Secondary | ICD-10-CM

## 2022-12-25 DIAGNOSIS — E781 Pure hyperglyceridemia: Secondary | ICD-10-CM | POA: Diagnosis not present

## 2022-12-25 DIAGNOSIS — Z1231 Encounter for screening mammogram for malignant neoplasm of breast: Secondary | ICD-10-CM

## 2022-12-25 DIAGNOSIS — Z0001 Encounter for general adult medical examination with abnormal findings: Secondary | ICD-10-CM

## 2022-12-25 DIAGNOSIS — R7303 Prediabetes: Secondary | ICD-10-CM | POA: Diagnosis not present

## 2022-12-25 DIAGNOSIS — F419 Anxiety disorder, unspecified: Secondary | ICD-10-CM | POA: Insufficient documentation

## 2022-12-25 DIAGNOSIS — F411 Generalized anxiety disorder: Secondary | ICD-10-CM

## 2022-12-25 DIAGNOSIS — E663 Overweight: Secondary | ICD-10-CM

## 2022-12-25 DIAGNOSIS — Z6826 Body mass index (BMI) 26.0-26.9, adult: Secondary | ICD-10-CM

## 2022-12-25 LAB — POCT URINALYSIS DIPSTICK
Bilirubin, UA: NEGATIVE
Blood, UA: NEGATIVE
Glucose, UA: NEGATIVE
Ketones, UA: NEGATIVE
Leukocytes, UA: NEGATIVE
Nitrite, UA: NEGATIVE
Protein, UA: NEGATIVE
Spec Grav, UA: 1.025 (ref 1.010–1.025)
Urobilinogen, UA: 0.2 U/dL
pH, UA: 6.5 (ref 5.0–8.0)

## 2022-12-25 MED ORDER — OZEMPIC (2 MG/DOSE) 8 MG/3ML ~~LOC~~ SOPN: 2.0000 mg | PEN_INJECTOR | SUBCUTANEOUS | 0 refills | Status: DC

## 2022-12-25 MED ORDER — VALACYCLOVIR HCL 1 G PO TABS: 1000.0000 mg | ORAL_TABLET | Freq: Two times a day (BID) | ORAL | 0 refills | Status: DC | PRN

## 2022-12-25 MED ORDER — SERTRALINE HCL 50 MG PO TABS: 50.0000 mg | ORAL_TABLET | Freq: Every day | ORAL | 1 refills | Status: DC

## 2022-12-25 NOTE — Assessment & Plan Note (Signed)
Encouraged diet and exercise for weight loss ?

## 2022-12-25 NOTE — Progress Notes (Signed)
Subjective:    Patient ID: Tracy Madden, female    DOB: 03/19/1971, 52 y.o.   MRN: [id number]  HPI  Patient presents to clinic today for her annual exam.  Flu: 11/2021 Tetanus: 06/2022 COVID: x 2 Shingrix: never Pap smear: Hysterectomy Mammogram: 05/2021 Colon screening: 06/2015 Vision screening: annually Dentist: biannually  Diet: She does eat meat. She consumes fruits and veggies. She does eat some fried foods. She driks mostly water. Exercise: Walking  Review of Systems     Past Medical History:  Diagnosis Date   Abdominal pain, LLQ    Abnormal uterine bleeding    Anemia    Cervical dysplasia    Endometriosis    Fibroid    Melanoma (HCC)    RT SHOULDER   Paratubal cyst    Pneumonia    Prediabetes     Current Outpatient Medications  Medication Sig Dispense Refill   amoxicillin-clavulanate (AUGMENTIN) 875-125 MG tablet Take 1 tablet by mouth 2 (two) times daily. 20 tablet 0   Semaglutide, 2 MG/DOSE, (OZEMPIC, 2 MG/DOSE,) 8 MG/3ML SOPN INJECT 2 MG INTO THE SKIN ONCE A WEEK. 9 mL 0   valACYclovir (VALTREX) 1000 MG tablet SMARTSIG:2 Tablet(s) By Mouth Every 12 Hours     VITAMIN D PO Take by mouth. As needed     No current facility-administered medications for this visit.    Allergies  Allergen Reactions   Egg-Derived Products Anaphylaxis   Niacin Hives, Rash and Other (See Comments)    Whelps in a matter of seconds    Family History  Problem Relation Age of Onset   Uterine cancer Mother     Social History   Socioeconomic History   Marital status: Married    Spouse name: Not on file   Number of children: Not on file   Years of education: Not on file   Highest education level: Not on file  Occupational History   Not on file  Tobacco Use   Smoking status: Never   Smokeless tobacco: Never  Vaping Use   Vaping status: Never Used  Substance and Sexual Activity   Alcohol use: Yes    Comment: OCCAS   Drug use: No   Sexual activity: Not  Currently    Birth control/protection: Surgical  Other Topics Concern   Not on file  Social History Narrative   Not on file   Social Determinants of Health   Financial Resource Strain: Not on file  Food Insecurity: Not on file  Transportation Needs: Not on file  Physical Activity: Not on file  Stress: Not on file  Social Connections: Not on file  Intimate Partner Violence: Not on file     Constitutional: Denies fever, malaise, fatigue, headache or abrupt weight changes.  HEENT: Denies eye pain, eye redness, ear pain, ringing in the ears, wax buildup, runny nose, nasal congestion, bloody nose, or sore throat. Respiratory: Denies difficulty breathing, shortness of breath, cough or sputum production.   Cardiovascular: Denies chest pain, chest tightness, palpitations or swelling in the hands or feet.  Gastrointestinal: Denies abdominal pain, bloating, constipation, diarrhea or blood in the stool.  GU: Denies urgency, frequency, pain with urination, burning sensation, blood in urine, odor or discharge. Musculoskeletal: Denies decrease in range of motion, difficulty with gait, muscle pain or joint pain and swelling.  Skin: Denies redness, rashes, lesions or ulcercations.  Neurological: Denies dizziness, difficulty with memory, difficulty with speech or problems with balance and coordination.  Psych: Patient reports anxiety.  Denies depression, SI/HI.  No other specific complaints in a complete review of systems (except as listed in HPI above).  Objective:   Physical Exam  BP 106/64 (BP Location: Left Arm, Patient Position: Sitting, Cuff Size: Normal)   Pulse 82   Temp (!) 95.7 F (35.4 C) (Temporal)   Ht 5\' 3"  (1.6 m)   Wt 147 lb (66.7 kg)   LMP  (LMP Unknown)   SpO2 99%   BMI 26.04 kg/m   Wt Readings from Last 3 Encounters:  08/04/22 144 lb (65.3 kg)  05/22/22 140 lb (63.5 kg)  04/13/22 139 lb (63 kg)    General: Appears her stated age,overweight, in NAD. Skin: Warm,  dry and intact.  HEENT: Head: normal shape and size; Eyes: sclera white, no icterus, conjunctiva pink, PERRLA and EOMs intact;  Neck:  Neck supple, trachea midline. No masses, lumps or thyromegaly present.  Cardiovascular: Normal rate and rhythm. S1,S2 noted.  No murmur, rubs or gallops noted. No JVD or BLE edema. No carotid bruits noted. Pulmonary/Chest: Normal effort and positive vesicular breath sounds. No respiratory distress. No wheezes, rales or ronchi noted.  Abdomen: Soft and nontender. Normal bowel sounds.  Musculoskeletal: Strength 5/5 BUE/BLE.  No difficulty with gait.  Neurological: Alert and oriented. Cranial nerves II-XII grossly intact. Coordination normal.  Psychiatric: Mood and affect normal.  Mildly anxious appearing. Judgment and thought content normal.    BMET    Component Value Date/Time   NA 142 08/04/2022 1510   NA 143 10/12/2018 1519   K 4.4 08/04/2022 1510   CL 104 08/04/2022 1510   CO2 30 08/04/2022 1510   GLUCOSE 98 08/04/2022 1510   BUN 18 08/04/2022 1510   BUN 13 10/12/2018 1519   CREATININE 0.68 08/04/2022 1510   CALCIUM 9.9 08/04/2022 1510   GFRNONAA 85 10/12/2018 1519   GFRAA 98 10/12/2018 1519    Lipid Panel     Component Value Date/Time   CHOL 199 08/04/2022 1510   CHOL 182 10/12/2018 1519   TRIG 172 (H) 08/04/2022 1510   HDL 56 08/04/2022 1510   HDL 41 10/12/2018 1519   CHOLHDL 3.6 08/04/2022 1510   LDLCALC 114 (H) 08/04/2022 1510    CBC    Component Value Date/Time   WBC 7.7 08/04/2022 1510   RBC 4.77 08/04/2022 1510   HGB 13.6 08/04/2022 1510   HGB 13.4 10/12/2018 1519   HCT 40.8 08/04/2022 1510   HCT 39.6 10/12/2018 1519   PLT 313 08/04/2022 1510   PLT 315 10/12/2018 1519   MCV 85.5 08/04/2022 1510   MCV 85 10/12/2018 1519   MCH 28.5 08/04/2022 1510   MCHC 33.3 08/04/2022 1510   RDW 12.5 08/04/2022 1510   RDW 12.7 10/12/2018 1519   LYMPHSABS 1.8 03/12/2016 1041   MONOABS 0.4 03/12/2016 1041   EOSABS 0.1 03/12/2016 1041    BASOSABS 0.0 03/12/2016 1041    Hgb A1C Lab Results  Component Value Date   HGBA1C 5.7 (H) 08/04/2022           Assessment & Plan:   Preventative health maintenance:  Flu shot UTD Tetanus UTD Encouraged her to get her COVID booster Discussed Shingrix vaccine, she will check coverage with her insurance company and schedule visit if she would like to have this done She no longer needs to screen for cervical cancer Mammogram ordered-she will call to schedule Colon screening UTD Encouraged her to consume a balanced diet and exercise regimen Advised her to see an  eye doctor and dentist annually Will check CBC, c-Met, lipid, A1c today  RTC in 6 months, follow-up chronic conditions Nicki Reaper, NP

## 2022-12-25 NOTE — Patient Instructions (Signed)

## 2022-12-25 NOTE — Assessment & Plan Note (Signed)
Triggered by working full-time and being in school as well We will start sertraline 50 mg daily Support offered

## 2022-12-25 NOTE — Addendum Note (Signed)
Addended by: Paschal Dopp on: 12/25/2022 03:36 PM   Modules accepted: Orders

## 2022-12-25 NOTE — Telephone Encounter (Signed)
Requested medication (s) are due for refill today: no  Requested medication (s) are on the active medication list: yes  Last refill:  today  Future visit scheduled: yes  Notes to clinic:  PA needed, please advise      Requested Prescriptions  Pending Prescriptions Disp Refills   OZEMPIC, 2 MG/DOSE, 8 MG/3ML SOPN [Pharmacy Med Name: OZEMPIC 8 MG/3 ML (2 MG/DOSE)]  0    Sig: Inject 2 mg into the skin once a week.     Endocrinology:  Diabetes - GLP-1 Receptor Agonists - semaglutide Failed - 12/25/2022  5:30 PM      Failed - HBA1C in normal range and within 180 days    Hgb A1c MFr Bld  Date Value Ref Range Status  08/04/2022 5.7 (H) <5.7 % of total Hgb Final    Comment:    For someone without known diabetes, a hemoglobin  A1c value between 5.7% and 6.4% is consistent with prediabetes and should be confirmed with a  follow-up test. . For someone with known diabetes, a value <7% indicates that their diabetes is well controlled. A1c targets should be individualized based on duration of diabetes, age, comorbid conditions, and other considerations. . This assay result is consistent with an increased risk of diabetes. . Currently, no consensus exists regarding use of hemoglobin A1c for diagnosis of diabetes for children. .          Passed - Cr in normal range and within 360 days    Creat  Date Value Ref Range Status  08/04/2022 0.68 0.50 - 1.03 mg/dL Final         Passed - Valid encounter within last 6 months    Recent Outpatient Visits           Today Encounter for general adult medical examination with abnormal findings   [facility] [place], Salvadore Oxford, NP   4 months ago Prediabetes   [place] [facility] [place], Salvadore Oxford, NP   7 months ago Chronic left shoulder pain   [facility] [place], Salvadore Oxford, NP   8 months ago Influenza A   [place] [facility] [place], Salvadore Oxford, NP   1  year ago Encounter for general adult medical examination with abnormal findings   [place] [facility] [place], Salvadore Oxford, NP       Future Appointments             In 6 months Baity, Salvadore Oxford, NP [facility]

## 2022-12-26 LAB — LIPID PANEL
Cholesterol: 177 mg/dL (ref <200)
HDL: 49 mg/dL — ABNORMAL LOW (ref >=50)
LDL Cholesterol (Calc): 96 mg/dL
Non-HDL Cholesterol (Calc): 128 mg/dL (ref <130)
Total CHOL/HDL Ratio: 3.6 (calc) (ref <5.0)
Triglycerides: 218 mg/dL — ABNORMAL HIGH (ref <150)

## 2022-12-26 LAB — URINE CULTURE
MICRO NUMBER:: 15526667
SPECIMEN QUALITY:: ADEQUATE

## 2022-12-26 LAB — CBC
HCT: 39.7 % (ref 35.0–45.0)
Hemoglobin: 13.2 g/dL (ref 11.7–15.5)
MCH: 29 pg (ref 27.0–33.0)
MCHC: 33.2 g/dL (ref 32.0–36.0)
MCV: 87.3 fL (ref 80.0–100.0)
MPV: 10.7 fL (ref 7.5–12.5)
Platelets: 257 10*3/uL (ref 140–400)
RBC: 4.55 10*6/uL (ref 3.80–5.10)
RDW: 12.5 % (ref 11.0–15.0)
WBC: 6 10*3/uL (ref 3.8–10.8)

## 2022-12-26 LAB — COMPLETE METABOLIC PANEL WITH GFR
AG Ratio: 2.2 (calc) (ref 1.0–2.5)
ALT: 13 U/L (ref 6–29)
AST: 16 U/L (ref 10–35)
Albumin: 4.6 g/dL (ref 3.6–5.1)
Alkaline phosphatase (APISO): 76 U/L (ref 37–153)
BUN: 15 mg/dL (ref 7–25)
CO2: 25 mmol/L (ref 20–32)
Calcium: 9.5 mg/dL (ref 8.6–10.4)
Chloride: 107 mmol/L (ref 98–110)
Creat: 0.59 mg/dL (ref 0.50–1.03)
Globulin: 2.1 g/dL (ref 1.9–3.7)
Glucose, Bld: 93 mg/dL (ref 65–99)
Potassium: 4.3 mmol/L (ref 3.5–5.3)
Sodium: 141 mmol/L (ref 135–146)
Total Bilirubin: 0.3 mg/dL (ref 0.2–1.2)
Total Protein: 6.7 g/dL (ref 6.1–8.1)
eGFR: 108 mL/min/{1.73_m2} (ref >=60)

## 2022-12-26 LAB — HEMOGLOBIN A1C
Hgb A1c MFr Bld: 5.7 %{Hb} — ABNORMAL HIGH (ref <5.7)
Mean Plasma Glucose: 117 mg/dL
eAG (mmol/L): 6.5 mmol/L

## 2022-12-28 ENCOUNTER — Other Ambulatory Visit: Payer: Self-pay | Admitting: Internal Medicine

## 2022-12-29 NOTE — Telephone Encounter (Signed)
Requested medication (s) are due for refill today: No  Requested medication (s) are on the active medication list: Yes  Last refill:  12/25/22  Future visit scheduled: Yes  Notes to clinic:  Unable to refill per protocol, medication requires a Prior Authorization.      Requested Prescriptions  Pending Prescriptions Disp Refills   OZEMPIC, 2 MG/DOSE, 8 MG/3ML SOPN [Pharmacy Med Name: OZEMPIC 8 MG/3 ML (2 MG/DOSE)]  0    Sig: Inject 2 mg into the skin once a week.     Endocrinology:  Diabetes - GLP-1 Receptor Agonists - semaglutide Failed - 12/28/2022 12:29 PM      Failed - HBA1C in normal range and within 180 days    Hgb A1c MFr Bld  Date Value Ref Range Status  12/25/2022 5.7 (H) <5.7 % of total Hgb Final    Comment:    For someone without known diabetes, a hemoglobin  A1c value between 5.7% and 6.4% is consistent with prediabetes and should be confirmed with a  follow-up test. . For someone with known diabetes, a value <7% indicates that their diabetes is well controlled. A1c targets should be individualized based on duration of diabetes, age, comorbid conditions, and other considerations. . This assay result is consistent with an increased risk of diabetes. . Currently, no consensus exists regarding use of hemoglobin A1c for diagnosis of diabetes for children. .          Passed - Cr in normal range and within 360 days    Creat  Date Value Ref Range Status  12/25/2022 0.59 0.50 - 1.03 mg/dL Final         Passed - Valid encounter within last 6 months    Recent Outpatient Visits           4 days ago Encounter for general adult medical examination with abnormal findings   [place] [place], Salvadore Oxford, NP   4 months ago Prediabetes   [place] [place], Salvadore Oxford, NP   7 months ago Chronic left shoulder pain   [place] [facility] [place], Salvadore Oxford, NP   8 months ago Influenza A   [place]  [facility] [place], Salvadore Oxford, NP   1 year ago Encounter for general adult medical examination with abnormal findings   [place] [facility] [place], Salvadore Oxford, NP       Future Appointments             In 5 months Baity, Salvadore Oxford, NP [place] [facility], [facility]

## 2023-01-17 ENCOUNTER — Other Ambulatory Visit: Payer: Self-pay | Admitting: Internal Medicine

## 2023-01-18 NOTE — Telephone Encounter (Signed)
Requested Prescriptions  Pending Prescriptions Disp Refills   valACYclovir (VALTREX) 1000 MG tablet [Pharmacy Med Name: VALACYCLOVIR HCL 1 GRAM TABLET] 60 tablet 0    Sig: TAKE 1 TABLET (1,000 MG TOTAL) BY MOUTH TWICE A DAY AS NEEDED     Antimicrobials:  Antiviral Agents - Anti-Herpetic Passed - 01/17/2023  9:34 AM      Passed - Valid encounter within last 12 months    Recent Outpatient Visits           3 weeks ago Encounter for general adult medical examination with abnormal findings   [place] [facility] [place], Salvadore Oxford, NP   5 months ago Prediabetes   [place] [facility] [place], [place] W, NP   8 months ago Chronic left shoulder pain   [place] [place], Salvadore Oxford, NP   9 months ago Influenza A   [place] [facility] [place], Salvadore Oxford, NP   1 year ago Encounter for general adult medical examination with abnormal findings   [place] [facility] [place], Salvadore Oxford, NP       Future Appointments             In 5 months Baity, Salvadore Oxford, NP  [facility], [facility]

## 2023-01-19 ENCOUNTER — Other Ambulatory Visit: Payer: Self-pay | Admitting: Internal Medicine

## 2023-01-20 NOTE — Telephone Encounter (Signed)
Requested medication (s) are due for refill today: yes  Requested medication (s) are on the active medication list: yes  Last refill:  12/25/22  Future visit scheduled: yes  Notes to clinic:  Pharmacy comment: Alternative Requested:PA REQUIRED.      Requested Prescriptions  Pending Prescriptions Disp Refills   OZEMPIC, 2 MG/DOSE, 8 MG/3ML SOPN [Pharmacy Med Name: OZEMPIC 8 MG/3 ML (2 MG/DOSE)]  0    Sig: Inject 2 mg into the skin once a week.     Endocrinology:  Diabetes - GLP-1 Receptor Agonists - semaglutide Failed - 01/19/2023  1:25 PM      Failed - HBA1C in normal range and within 180 days    Hgb A1c MFr Bld  Date Value Ref Range Status  12/25/2022 5.7 (H) <5.7 % of total Hgb Final    Comment:    For someone without known diabetes, a hemoglobin  A1c value between 5.7% and 6.4% is consistent with prediabetes and should be confirmed with a  follow-up test. . For someone with known diabetes, a value <7% indicates that their diabetes is well controlled. A1c targets should be individualized based on duration of diabetes, age, comorbid conditions, and other considerations. . This assay result is consistent with an increased risk of diabetes. . Currently, no consensus exists regarding use of hemoglobin A1c for diagnosis of diabetes for children. .          Passed - Cr in normal range and within 360 days    Creat  Date Value Ref Range Status  12/25/2022 0.59 0.50 - 1.03 mg/dL Final         Passed - Valid encounter within last 6 months    Recent Outpatient Visits           3 weeks ago Encounter for general adult medical examination with abnormal findings   [place] [facility] [place], Salvadore Oxford, NP   5 months ago Prediabetes   [place] [facility] [place], Salvadore Oxford, NP   8 months ago Chronic left shoulder pain   [place] [facility] [place], Salvadore Oxford, NP   9 months ago Influenza A   [place] [facility] [place], Salvadore Oxford, NP   1 year ago Encounter for general adult medical examination with abnormal findings   [place] [facility] [place], Salvadore Oxford, NP       Future Appointments             In 5 months Baity, Salvadore Oxford, NP [place] [facility], [facility]

## 2023-01-21 NOTE — Telephone Encounter (Signed)
Can we do a prior Auth on this?

## 2023-01-25 ENCOUNTER — Ambulatory Visit: Payer: Commercial Managed Care - PPO

## 2023-01-25 NOTE — Progress Notes (Signed)
   Information regarding your request [employer] does not handle this review. Please visit [email or web address] to start a prior authorization or fax information to [phone]. Please include all supporting chart notes. You may contact [employer] at [phone].

## 2023-01-25 NOTE — Progress Notes (Signed)
Benita Walters (Key: [id number]) Ozempic (2 MG/DOSE) 8MG /3ML pen-injectors Form [employer] Electronic Prior Authorization Form (2017 NCPDP) Created 4 days ago Sent to Plan 4 days ago Plan Response 4 days ago Submit Clinical Questions Determination Message from Plan [employer] does not handle this review. Please visit [email or web address] to start a prior authorization or fax information to [phone]. Please include all supporting chart notes. You may contact [employer] at [phone].

## 2023-01-25 NOTE — Progress Notes (Signed)
The following information is provided by CoverMyMeds.  Patient Assistance Patient assistance and financial support may be available through the manufacturer's patient assistance program. For more information, and to see program requirements, follow the link below:   Patient assistance program: click here.  Diagnosis OZEMPIC is a glucagon-like peptide 1 (GLP-1) receptor agonist indicated as: an adjunct to diet and exercise to improve glycemic control in adults with type 2 diabetes mellitus  to reduce the risk of major adverse cardiovascular events in adults with type 2 diabetes mellitus and established cardiovascular disease Limitations of Use: Has not been studied in patients with a history of pancreatitis. Consider another antidiabetic therapy Not for treatment of type 1 diabetes mellitus   Commonly used ICD-10 diagnosis code(s): E11.* (Type 2 diabetes mellitus)   * Indicates multiple available codes within a diagnosis category   These codes are presented for informational purposes only. They represent no statement, promise, or guarantee concerning coverage and/or levels of reimbursement, payment, or charge, and are not intended to increase or maximize reimbursement by any payer. It is the responsibility of the healthcare provider to determine appropriate code(s) for services provided to their patient.  Prescribing Information: click here.  Important Safety Information: click here.   Dosing Information Ozempic 0.25 mg or 0.5 mg/dose comes in a carton with one 3 mL pen. Quantity is typically: 3 mL per 28 days (Note: while initiation is often 42 days, some plans may only cover a shorter period. Please check your patient's specific coverage to confirm) 9 mL per 84 days  Ozempic 1 mg/dose comes in a carton with one 3 mL pen. Quantity is typically: 3 mL per 28 days 9 mL per 84 days  Ozempic 2 mg/dose comes in a carton with one 3 mL pen. Quantity is typically: 3 mL per 28 days 9 mL  per 84 days   Please indicate the patient's previous medication history in a free text field (if available) for any of the following medications: Canagliflozin (Invokana) Dapagliflozin (Farxiga) Dulaglutide (Trulicity) Empagliflozin (Jardiance) Ertugliflozin Psychologist, sport and exercise) Exenatide ER (Bydureon) Exenatide IR (Byetta) Glimepiride (Amaryl) Glipizide (Glucotrol) Glucophage (Metformin) Glyburide (Diabeta, Glynase, Micronase) Insulin degludec Evaristo Bury) Insulin detemir (Levemir) Insulin glargine U100 (Basaglar, Lantus) Insulin glargine-yfgn (Semglee) Insulin glargine U300 (Toujeo) Linagliptin (Tradjenta) Liraglutide (Saxenda, Victoza) Pioglitazone (Actos) Semaglutide (Rybelsus)  Saxagliptin (Onglyza) Sitagliptin (Januvia) Other (Please specify other)  Please also make note of the following in a free text field (if available): What is the patient's diagnosis? Type 2 diabetes mellitus Other Please specify other Has the patient tried and failed or been unable to tolerate metformin (alone or in combination)? (Y/N) What is the patient's most recent hemoglobin A1c?  Is this a request for an initiation or continuation of therapy? Initiation Continuation Does the prescriber believe there has been a positive clinical response while on OZEMPIC (e.g. documented reduction in hemoglobin A1c)? (Y/N)

## 2023-01-26 ENCOUNTER — Ambulatory Visit: Payer: Commercial Managed Care - PPO

## 2023-01-26 DIAGNOSIS — Z23 Encounter for immunization: Secondary | ICD-10-CM

## 2023-01-26 DIAGNOSIS — Z719 Counseling, unspecified: Secondary | ICD-10-CM

## 2023-01-26 NOTE — Progress Notes (Signed)
Client seen in nurse clinic for final TDAP.  Reported received flu/covid with her provider.  Vaccine administered and tolerated well.  Kayode Petion Sherrilyn Rist, RN

## 2023-01-29 NOTE — Progress Notes (Signed)
Records faxed to RX benefits per covermymeds [phone]

## 2023-02-10 ENCOUNTER — Other Ambulatory Visit: Payer: Self-pay | Admitting: Internal Medicine

## 2023-02-11 NOTE — Telephone Encounter (Signed)
Requested medication (s) are due for refill today:yes  Requested medication (s) are on the active medication list:yes  Last refill:  01/18/23 #60  Future visit scheduled: yes  Notes to clinic:  Pharmacy requesting 90 day refill   Requested Prescriptions  Pending Prescriptions Disp Refills   valACYclovir (VALTREX) 1000 MG tablet [Pharmacy Med Name: VALACYCLOVIR HCL 1 GRAM TABLET] 180 tablet 1    Sig: TAKE 1 TABLET (1,000 MG TOTAL) BY MOUTH TWICE A DAY AS NEEDED     Antimicrobials:  Antiviral Agents - Anti-Herpetic Passed - 02/10/2023 12:33 PM      Passed - Valid encounter within last 12 months    Recent Outpatient Visits           1 month ago Encounter for general adult medical examination with abnormal findings   [place] [facility] [place], Salvadore Oxford, NP   6 months ago Prediabetes   [place] [facility] [place], [place] W, NP   8 months ago Chronic left shoulder pain   [place] [facility] [place], Salvadore Oxford, NP   10 months ago Influenza A   [place] [place], Salvadore Oxford, NP   1 year ago Encounter for general adult medical examination with abnormal findings   [place] [facility] [place], Salvadore Oxford, NP       Future Appointments             In 4 months Baity, Salvadore Oxford, NP [place] [facility], [facility]

## 2023-02-15 ENCOUNTER — Other Ambulatory Visit: Payer: Self-pay | Admitting: Internal Medicine

## 2023-02-16 NOTE — Telephone Encounter (Signed)
Requested Prescriptions  Pending Prescriptions Disp Refills   Semaglutide, 2 MG/DOSE, (OZEMPIC, 2 MG/DOSE,) 8 MG/3ML SOPN [Pharmacy Med Name: OZEMPIC 8 MG/3 ML (2 MG/DOSE)] 3 mL 0    Sig: INJECT 2 MG INTO THE SKIN ONCE A WEEK.     Endocrinology:  Diabetes - GLP-1 Receptor Agonists - semaglutide Failed - 02/15/2023  5:06 PM      Failed - HBA1C in normal range and within 180 days    Hgb A1c MFr Bld  Date Value Ref Range Status  12/25/2022 5.7 (H) <5.7 % of total Hgb Final    Comment:    For someone without known diabetes, a hemoglobin  A1c value between 5.7% and 6.4% is consistent with prediabetes and should be confirmed with a  follow-up test. . For someone with known diabetes, a value <7% indicates that their diabetes is well controlled. A1c targets should be individualized based on duration of diabetes, age, comorbid conditions, and other considerations. . This assay result is consistent with an increased risk of diabetes. . Currently, no consensus exists regarding use of hemoglobin A1c for diagnosis of diabetes for children. .          Passed - Cr in normal range and within 360 days    Creat  Date Value Ref Range Status  12/25/2022 0.59 0.50 - 1.03 mg/dL Final         Passed - Valid encounter within last 6 months    Recent Outpatient Visits           1 month ago Encounter for general adult medical examination with abnormal findings   [place] [facility] [place], Salvadore Oxford, NP   6 months ago Prediabetes   [place] [facility] [place], Salvadore Oxford, NP   9 months ago Chronic left shoulder pain   [place] [facility] [place], Salvadore Oxford, NP   10 months ago Influenza A   [place] [facility] [place], Salvadore Oxford, NP   1 year ago Encounter for general adult medical examination with abnormal findings   [place] [facility] [place], Salvadore Oxford, NP       Future Appointments             In 4  months Baity, Salvadore Oxford, NP  [facility], [facility]

## 2023-03-11 ENCOUNTER — Ambulatory Visit: Payer: Commercial Managed Care - PPO | Admitting: Internal Medicine

## 2023-03-17 ENCOUNTER — Ambulatory Visit
Admission: RE | Admit: 2023-03-17 | Discharge: 2023-03-17 | Disposition: A | Discharge status: Home / Self Care | Payer: Commercial Managed Care - PPO | Source: Ambulatory Visit | Attending: Internal Medicine | Admitting: Internal Medicine

## 2023-03-17 DIAGNOSIS — Z1231 Encounter for screening mammogram for malignant neoplasm of breast: Secondary | ICD-10-CM | POA: Diagnosis present

## 2023-03-20 ENCOUNTER — Other Ambulatory Visit: Payer: Self-pay | Admitting: Internal Medicine

## 2023-03-23 NOTE — Telephone Encounter (Signed)
A1C in date.  Requested Prescriptions  Pending Prescriptions Disp Refills   Semaglutide, 2 MG/DOSE, (OZEMPIC, 2 MG/DOSE,) 8 MG/3ML SOPN [Pharmacy Med Name: OZEMPIC 8 MG/3 ML (2 MG/DOSE)] 3 mL 0    Sig: INJECT 2 MG INTO THE SKIN ONCE A WEEK.     Endocrinology:  Diabetes - GLP-1 Receptor Agonists - semaglutide Failed - 03/23/2023  8:12 AM      Failed - HBA1C in normal range and within 180 days    Hgb A1c MFr Bld  Date Value Ref Range Status  12/25/2022 5.7 (H) <5.7 % of total Hgb Final    Comment:    For someone without known diabetes, a hemoglobin  A1c value between 5.7% and 6.4% is consistent with prediabetes and should be confirmed with a  follow-up test. . For someone with known diabetes, a value <7% indicates that their diabetes is well controlled. A1c targets should be individualized based on duration of diabetes, age, comorbid conditions, and other considerations. . This assay result is consistent with an increased risk of diabetes. . Currently, no consensus exists regarding use of hemoglobin A1c for diagnosis of diabetes for children. .          Passed - Cr in normal range and within 360 days    Creat  Date Value Ref Range Status  12/25/2022 0.59 0.50 - 1.03 mg/dL Final         Passed - Valid encounter within last 6 months    Recent Outpatient Visits           2 months ago Encounter for general adult medical examination with abnormal findings   [place] [facility] [place], Salvadore Oxford, NP   7 months ago Prediabetes   [place] [facility] [place], Salvadore Oxford, NP   10 months ago Chronic left shoulder pain   [place] [facility] [place], Salvadore Oxford, NP   11 months ago Influenza A   [place] [facility] [place], Salvadore Oxford, NP   1 year ago Encounter for general adult medical examination with abnormal findings   [place] [facility] [place], Salvadore Oxford, NP       Future Appointments              In 3 months Baity, Salvadore Oxford, NP [facility], [facility]

## 2023-04-25 ENCOUNTER — Other Ambulatory Visit: Payer: Self-pay | Admitting: Internal Medicine

## 2023-04-26 NOTE — Telephone Encounter (Signed)
Requested Prescriptions  Pending Prescriptions Disp Refills   Semaglutide, 2 MG/DOSE, (OZEMPIC, 2 MG/DOSE,) 8 MG/3ML SOPN [Pharmacy Med Name: OZEMPIC 8 MG/3 ML (2 MG/DOSE)] 3 mL 1    Sig: INJECT 2 MG INTO THE SKIN ONCE A WEEK.     Endocrinology:  Diabetes - GLP-1 Receptor Agonists - semaglutide Failed - 04/26/2023  1:15 PM      Failed - HBA1C in normal range and within 180 days    Hgb A1c MFr Bld  Date Value Ref Range Status  12/25/2022 5.7 (H) <5.7 % of total Hgb Final    Comment:    For someone without known diabetes, a hemoglobin  A1c value between 5.7% and 6.4% is consistent with prediabetes and should be confirmed with a  follow-up test. . For someone with known diabetes, a value <7% indicates that their diabetes is well controlled. A1c targets should be individualized based on duration of diabetes, age, comorbid conditions, and other considerations. . This assay result is consistent with an increased risk of diabetes. . Currently, no consensus exists regarding use of hemoglobin A1c for diagnosis of diabetes for children. .          Passed - Cr in normal range and within 360 days    Creat  Date Value Ref Range Status  12/25/2022 0.59 0.50 - 1.03 mg/dL Final         Passed - Valid encounter within last 6 months    Recent Outpatient Visits           4 months ago Encounter for general adult medical examination with abnormal findings   [place] [facility] [place], Salvadore Oxford, NP   8 months ago Prediabetes   [place] [facility] [place], Salvadore Oxford, NP   11 months ago Chronic left shoulder pain   [place] [facility] [place], Salvadore Oxford, NP   1 year ago Influenza A   [place] [facility] [place], Salvadore Oxford, NP   1 year ago Encounter for general adult medical examination with abnormal findings   [place] [facility] [place], Salvadore Oxford, NP       Future Appointments             In 2  months Baity, Salvadore Oxford, NP [facility], [facility]

## 2023-06-19 ENCOUNTER — Other Ambulatory Visit: Payer: Self-pay | Admitting: Internal Medicine

## 2023-06-21 NOTE — Telephone Encounter (Signed)
 Requested Prescriptions  Pending Prescriptions Disp Refills   sertraline (ZOLOFT) 50 MG tablet [Pharmacy Med Name: SERTRALINE HCL 50 MG TABLET] 90 tablet 1    Sig: TAKE 1 TABLET BY MOUTH EVERY DAY     Psychiatry:  Antidepressants - SSRI - sertraline Passed - 06/21/2023  1:59 PM      Passed - AST in normal range and within 360 days    AST  Date Value Ref Range Status  12/25/2022 16 10 - 35 U/L Final         Passed - ALT in normal range and within 360 days    ALT  Date Value Ref Range Status  12/25/2022 13 6 - 29 U/L Final         Passed - Completed PHQ-2 or PHQ-9 in the last 360 days      Passed - Valid encounter within last 6 months    Recent Outpatient Visits           5 months ago Encounter for general adult medical examination with abnormal findings   [place] [facility] [place], Salvadore Oxford, NP   10 months ago Prediabetes   [place] [facility] [place], Salvadore Oxford, NP   1 year ago Chronic left shoulder pain   [place] [facility] [place], Salvadore Oxford, NP   1 year ago Influenza A   [place] [facility] [place], Salvadore Oxford, NP   1 year ago Encounter for general adult medical examination with abnormal findings   [place] [facility] [place], Salvadore Oxford, NP       Future Appointments             In 4 days [place], Salvadore Oxford, NP [place] [facility]             Semaglutide, 2 MG/DOSE, (OZEMPIC, 2 MG/DOSE,) 8 MG/3ML SOPN [Pharmacy Med Name: OZEMPIC 8 MG/3 ML (2 MG/DOSE)] 3 mL 1    Sig: INJECT 2 MG INTO THE SKIN ONCE A WEEK.     Endocrinology:  Diabetes - GLP-1 Receptor Agonists - semaglutide Failed - 06/21/2023  1:59 PM      Failed - HBA1C in normal range and within 180 days    Hgb A1c MFr Bld  Date Value Ref Range Status  12/25/2022 5.7 (H) <5.7 % of total Hgb Final    Comment:    For someone without known diabetes, a hemoglobin  A1c value between 5.7% and 6.4% is  consistent with prediabetes and should be confirmed with a  follow-up test. . For someone with known diabetes, a value <7% indicates that their diabetes is well controlled. A1c targets should be individualized based on duration of diabetes, age, comorbid conditions, and other considerations. . This assay result is consistent with an increased risk of diabetes. . Currently, no consensus exists regarding use of hemoglobin A1c for diagnosis of diabetes for children. .          Passed - Cr in normal range and within 360 days    Creat  Date Value Ref Range Status  12/25/2022 0.59 0.50 - 1.03 mg/dL Final         Passed - Valid encounter within last 6 months    Recent Outpatient Visits           5 months ago Encounter for general adult medical examination with abnormal findings   [place] [facility]  Lorre Munroe, NP   10 months ago Prediabetes   [facility] [place], Salvadore Oxford, NP   1 year ago Chronic left shoulder pain   [place] [facility] [place], Salvadore Oxford, NP   1 year ago Influenza A   [place] [facility] [place], Salvadore Oxford, NP   1 year ago Encounter for general adult medical examination with abnormal findings   [place] [facility] [place], Salvadore Oxford, NP       Future Appointments             In 4 days [place], Salvadore Oxford, NP [facility], [facility]

## 2023-06-25 ENCOUNTER — Ambulatory Visit: Payer: Self-pay | Admitting: Internal Medicine

## 2023-06-25 ENCOUNTER — Ambulatory Visit: Admitting: Internal Medicine

## 2023-06-25 NOTE — Progress Notes (Deleted)
 Subjective:    Patient ID: Tracy Madden, female    DOB: 1970-10-30, 52 y.o.   MRN: [id number]  HPI  Patient presents to clinic today for follow-up of chronic conditions.    HLD: Her last LDL was 96, triglycerides 409, 11/2022.  She is not taking any cholesterol-lowering medication at this time.  She tries to consume low-fat diet.  Prediabetes: Her last A1c was 5.7%, 11/2022.  She is taking ozempic as prescribed.  She does not check her sugars.  Anxiety: Chronic, managed on sertraline.  She is not currently seeing a therapist.  She denies depression, SI/HI.  Review of Systems     Past Medical History:  Diagnosis Date   Abdominal pain, LLQ    Abnormal uterine bleeding    Anemia    Cervical dysplasia    Endometriosis    Fibroid    Melanoma (HCC)    RT SHOULDER   Paratubal cyst    Pneumonia    Prediabetes     Current Outpatient Medications  Medication Sig Dispense Refill   Multiple Vitamin (MULTIVITAMIN) capsule Take 1 capsule by mouth daily.     Semaglutide, 2 MG/DOSE, (OZEMPIC, 2 MG/DOSE,) 8 MG/3ML SOPN INJECT 2 MG INTO THE SKIN ONCE A WEEK. 3 mL 1   sertraline (ZOLOFT) 50 MG tablet TAKE 1 TABLET BY MOUTH EVERY DAY 90 tablet 1   valACYclovir (VALTREX) 1000 MG tablet TAKE 1 TABLET (1,000 MG TOTAL) BY MOUTH TWICE A DAY AS NEEDED 180 tablet 1   No current facility-administered medications for this visit.    Allergies  Allergen Reactions   Egg-Derived Products Anaphylaxis   Niacin Hives, Rash and Other (See Comments)    Whelps in a matter of seconds    Family History  Problem Relation Age of Onset   Uterine cancer Mother     Social History   Socioeconomic History   Marital status: Married    Spouse name: Not on file   Number of children: Not on file   Years of education: Not on file   Highest education level: Not on file  Occupational History   Not on file  Tobacco Use   Smoking status: Never   Smokeless tobacco: Never  Vaping Use   Vaping status:  Never Used  Substance and Sexual Activity   Alcohol use: Yes    Comment: OCCAS   Drug use: No   Sexual activity: Not Currently    Birth control/protection: Surgical  Other Topics Concern   Not on file  Social History Narrative   Not on file   Social Drivers of Health   Financial Resource Strain: Not on file  Food Insecurity: Not on file  Transportation Needs: Not on file  Physical Activity: Not on file  Stress: Not on file  Social Connections: Not on file  Intimate Partner Violence: Not on file     Constitutional: Denies fever, malaise, fatigue, headache or abrupt weight changes.  HEENT: Denies eye pain, eye redness, ear pain, ringing in the ears, wax buildup, runny nose, nasal congestion, bloody nose, or sore throat. Respiratory: Denies difficulty breathing, shortness of breath, cough or sputum production.   Cardiovascular: Denies chest pain, chest tightness, palpitations or swelling in the hands or feet.  Gastrointestinal: Denies abdominal pain, bloating, constipation, diarrhea or blood in the stool.  GU: Pt reports dysuria, recurrent UTI's. Denies urgency, frequency, pain with urination, burning sensation, blood in urine, odor or discharge. Musculoskeletal: Denies decrease in range of motion, difficulty with  gait, muscle pain or joint pain and swelling.  Skin: Denies redness, rashes, lesions or ulcercations.  Neurological: Denies dizziness, difficulty with memory, difficulty with speech or problems with balance and coordination.  Psych: Patient has a history of anxiety.  Denies depression, SI/HI.  No other specific complaints in a complete review of systems (except as listed in HPI above).  Objective:   Physical Exam   LMP  (LMP Unknown)   Wt Readings from Last 3 Encounters:  12/25/22 147 lb (66.7 kg)  08/04/22 144 lb (65.3 kg)  05/22/22 140 lb (63.5 kg)    General: Appears her stated age, overweight,  in NAD. Skin: Warm, dry and intact.  HEENT: Head: normal  shape and size; Eyes: sclera white, no icterus, conjunctiva pink, PERRLA and EOMs intact;  Cardiovascular: Normal rate and rhythm. S1,S2 noted.  No murmur, rubs or gallops noted. No JVD or BLE edema. No carotid bruits noted. Pulmonary/Chest: Normal effort and positive vesicular breath sounds. No respiratory distress. No wheezes, rales or ronchi noted.  Musculoskeletal:  No difficulty with gait.  Neurological: Alert and oriented. Cranial nerves II-XII grossly intact. Coordination normal.  Psychiatric: Mood and affect normal. Behavior is normal. Judgment and thought content normal.    BMET    Component Value Date/Time   NA 141 12/25/2022 1346   NA 143 10/12/2018 1519   K 4.3 12/25/2022 1346   CL 107 12/25/2022 1346   CO2 25 12/25/2022 1346   GLUCOSE 93 12/25/2022 1346   BUN 15 12/25/2022 1346   BUN 13 10/12/2018 1519   CREATININE 0.59 12/25/2022 1346   CALCIUM 9.5 12/25/2022 1346   GFRNONAA 85 10/12/2018 1519   GFRAA 98 10/12/2018 1519    Lipid Panel     Component Value Date/Time   CHOL 177 12/25/2022 1346   CHOL 182 10/12/2018 1519   TRIG 218 (H) 12/25/2022 1346   HDL 49 (L) 12/25/2022 1346   HDL 41 10/12/2018 1519   CHOLHDL 3.6 12/25/2022 1346   LDLCALC 96 12/25/2022 1346    CBC    Component Value Date/Time   WBC 6.0 12/25/2022 1346   RBC 4.55 12/25/2022 1346   HGB 13.2 12/25/2022 1346   HGB 13.4 10/12/2018 1519   HCT 39.7 12/25/2022 1346   HCT 39.6 10/12/2018 1519   PLT 257 12/25/2022 1346   PLT 315 10/12/2018 1519   MCV 87.3 12/25/2022 1346   MCV 85 10/12/2018 1519   MCH 29.0 12/25/2022 1346   MCHC 33.2 12/25/2022 1346   RDW 12.5 12/25/2022 1346   RDW 12.7 10/12/2018 1519   LYMPHSABS 1.8 03/12/2016 1041   MONOABS 0.4 03/12/2016 1041   EOSABS 0.1 03/12/2016 1041   BASOSABS 0.0 03/12/2016 1041    Hgb A1C Lab Results  Component Value Date   HGBA1C 5.7 (H) 12/25/2022           Assessment & Plan:      RTC in 6  months for your annual  exam Nicki Reaper, NP

## 2023-06-29 NOTE — Patient Instructions (Signed)
 Preventive Care 13-53 Years Old, Female Preventive care refers to lifestyle choices and visits with your health care provider that can promote health and wellness. Preventive care visits are also called wellness exams. What can I expect for my preventive care visit? Counseling Your health care provider may ask you questions about your: Medical history, including: Past medical problems. Family medical history. Pregnancy history. Current health, including: Menstrual cycle. Method of birth control. Emotional well-being. Home life and relationship well-being. Sexual activity and sexual health. Lifestyle, including: Alcohol, nicotine or tobacco, and drug use. Access to firearms. Diet, exercise, and sleep habits. Work and work Astronomer. Sunscreen use. Safety issues such as seatbelt and bike helmet use. Physical exam Your health care provider will check your: Height and weight. These may be used to calculate your BMI (body mass index). BMI is a measurement that tells if you are at a healthy weight. Waist circumference. This measures the distance around your waistline. This measurement also tells if you are at a healthy weight and may help predict your risk of certain diseases, such as type 2 diabetes and high blood pressure. Heart rate and blood pressure. Body temperature. Skin for abnormal spots. What immunizations do I need?  Vaccines are usually given at various ages, according to a schedule. Your health care provider will recommend vaccines for you based on your age, medical history, and lifestyle or other factors, such as travel or where you work. What tests do I need? Screening Your health care provider may recommend screening tests for certain conditions. This may include: Lipid and cholesterol levels. Diabetes screening. This is done by checking your blood sugar (glucose) after you have not eaten for a while (fasting). Pelvic exam and Pap test. Hepatitis B test. Hepatitis C  test. HIV (human immunodeficiency virus) test. STI (sexually transmitted infection) testing, if you are at risk. Lung cancer screening. Colorectal cancer screening. Mammogram. Talk with your health care provider about when you should start having regular mammograms. This may depend on whether you have a family history of breast cancer. BRCA-related cancer screening. This may be done if you have a family history of breast, ovarian, tubal, or peritoneal cancers. Bone density scan. This is done to screen for osteoporosis. Talk with your health care provider about your test results, treatment options, and if necessary, the need for more tests. Follow these instructions at home: Eating and drinking  Eat a diet that includes fresh fruits and vegetables, whole grains, lean protein, and low-fat dairy products. Take vitamin and mineral supplements as recommended by your health care provider. Do not drink alcohol if: Your health care provider tells you not to drink. You are pregnant, may be pregnant, or are planning to become pregnant. If you drink alcohol: Limit how much you have to 0-1 drink a day. Know how much alcohol is in your drink. In the [place] one drink equals one 12 oz bottle of beer (355 mL), one 5 oz glass of wine (148 mL), or one 1 oz glass of hard liquor (44 mL). Lifestyle Brush your teeth every morning and night with fluoride toothpaste. Floss one time each day. Exercise for at least 30 minutes 5 or more days each week. Do not use any products that contain nicotine or tobacco. These products include cigarettes, chewing tobacco, and vaping devices, such as e-cigarettes. If you need help quitting, ask your health care provider. Do not use drugs. If you are sexually active, practice safe sex. Use a condom or other form of protection to  prevent STIs. If you do not wish to become pregnant, use a form of birth control. If you plan to become pregnant, see your health care provider for a  prepregnancy visit. Take aspirin only as told by your health care provider. Make sure that you understand how much to take and what form to take. Work with your health care provider to find out whether it is safe and beneficial for you to take aspirin daily. Find healthy ways to manage stress, such as: Meditation, yoga, or listening to music. Journaling. Talking to a trusted person. Spending time with friends and family. Minimize exposure to UV radiation to reduce your risk of skin cancer. Safety Always wear your seat belt while driving or riding in a vehicle. Do not drive: If you have been drinking alcohol. Do not ride with someone who has been drinking. When you are tired or distracted. While texting. If you have been using any mind-altering substances or drugs. Wear a helmet and other protective equipment during sports activities. If you have firearms in your house, make sure you follow all gun safety procedures. Seek help if you have been physically or sexually abused. What's next? Visit your health care provider once a year for an annual wellness visit. Ask your health care provider how often you should have your eyes and teeth checked. Stay up to date on all vaccines. This information is not intended to replace advice given to you by your health care provider. Make sure you discuss any questions you have with your health care provider. Document Revised: 09/11/2020 Document Reviewed: 09/11/2020 Elsevier Patient Education  2024 Elsevier Inc.  Breast self-awareness is knowing how your breasts look and feel. You need to: Check your breasts on a regular basis. Tell your doctor about any changes. Become familiar with the look and feel of your breasts. This can help you catch a breast problem while it is still small and can be treated. You should do breast self-exams even if you have breast implants. What you need: A mirror. A well-lit room. A pillow or other soft object. How to do  a breast self-exam Follow these steps to do a breast self-exam: Look for changes  Take off all the clothes above your waist. Stand in front of a mirror in a room with good lighting. Put your hands down at your sides. Compare your breasts in the mirror. Look for any difference between them, such as: A difference in shape. A difference in size. Wrinkles, dips, and bumps in one breast and not the other. Look at each breast for changes in the skin, such as: Redness. Scaly areas. Skin that has gotten thicker. Dimpling. Open sores (ulcers). Look for changes in your nipples, such as: Fluid coming out of a nipple. Fluid around a nipple. Bleeding. Dimpling. Redness. A nipple that looks pushed in (retracted), or that has changed position. Feel for changes Lie on your back. Feel each breast. To do this: Pick a breast to feel. Place a pillow under the shoulder closest to that breast. Put the arm closest to that breast behind your head. Feel the nipple area of that breast using the hand of your other arm. Feel the area with the pads of your three middle fingers by making small circles with your fingers. Use light, medium, and firm pressure. Continue the overlapping circles, moving downward over the breast. Keep making circles with your fingers. Stop when you feel your ribs. Start making circles with your fingers again, this time going upward  until you reach your collarbone. Then, make circles outward across your breast and into your armpit area. Squeeze your nipple. Check for discharge and lumps. Repeat these steps to check your other breast. Sit or stand in the tub or shower. With soapy water on your skin, feel each breast the same way you did when you were lying down. Write down what you find Writing down what you find can help you remember what to tell your doctor. Write down: What is normal for each breast. Any changes you find in each breast. These include: The kind of changes you  find. A tender or painful breast. Any lump you find. Write down its size and where it is. When you last had your monthly period (menstrual cycle). General tips If you are breastfeeding, the best time to check your breasts is after you feed your baby or after you use a breast pump. If you get monthly bleeding, the best time to check your breasts is 5-7 days after your monthly cycle ends. With time, you will become comfortable with the self-exam. You will also start to know if there are changes in your breasts. Contact a doctor if: You see a change in the shape or size of your breasts or nipples. You see a change in the skin of your breast or nipples, such as red or scaly skin. You have fluid coming from your nipples that is not normal. You find a new lump or thick area. You have breast pain. You have any concerns about your breast health. Summary Breast self-awareness includes looking for changes in your breasts and feeling for changes within your breasts. You should do breast self-awareness in front of a mirror in a well-lit room. If you get monthly periods (menstrual cycles), the best time to check your breasts is 5-7 days after your period ends. Tell your doctor about any changes you see in your breasts. Changes include changes in size, changes on the skin, painful or tender breasts, or fluid from your nipples that is not normal. This information is not intended to replace advice given to you by your health care provider. Make sure you discuss any questions you have with your health care provider. Document Revised: 08/21/2021 Document Reviewed: 01/16/2021 Elsevier Patient Education  2024 [employer].

## 2023-06-29 NOTE — Progress Notes (Unsigned)
 ANNUAL PREVENTATIVE CARE GYNECOLOGY  ENCOUNTER NOTE  Subjective:   Tracy Madden is a 53 y.o. G75P1001 female who presents for an annual exam. She is s/p LAVH with bilateral salpingectomy and left oophorectomy 01/2018 (for h/o endometriosis, pelvic pain, and h/o cervical dysplasia s/p LEEP).  The patient is not taking any hormone replacement therapy. The patient is not sexually active. The patient participates in regular exercise: no. Has the patient ever been transfused or tattooed?: no. The patient reports that there is not domestic violence in her life.   The patient has the following complaints today: Reports issues with stress incontinence over the past year. Notes that it has gradually worsened.  Notes leakage of urine mostly with coughing or sneezing, but occasionally feels some moisture even if she is sitting at rest after some time.  Also feels like her bladder may be dropping. This has been ongoing for possibly the last 6 months.   Menstrual History: Menarche age: 79 No LMP recorded (lmp unknown). Patient has had a hysterectomy.    Gynecologic History:  Contraception: status post hysterectomy History of STI's: Denies Last Pap: 08/25/2017. Results were: normal. Reports h/o abnormal pap smears.  Has remote h/o LEEP procedure.  Last mammogram: 03/17/2023. Results were: normal Last Colonoscopy: 07/02/2015.  Results were: normal.  Last Dexa Scan: Never had one   Obstetric History OB History  Gravida Para Term Preterm AB Living  1 1 1   1   SAB IAB Ectopic Multiple Live Births      1    # Outcome Date GA Lbr Len/2nd Weight Sex Type Anes PTL Lv  1 Term 66    M Vag-Spont   LIV    Past Medical History:  Diagnosis Date   Abdominal pain, LLQ    Abnormal uterine bleeding    Anemia    Cervical dysplasia    Endometriosis    Fibroid    Melanoma (HCC)    RT SHOULDER   Paratubal cyst    Pneumonia    Prediabetes     Family History  Problem Relation Age of Onset    Uterine cancer Mother     Past Surgical History:  Procedure Laterality Date   BREAST BIOPSY Right 03/22/2019   Affirm Bx-"X" clip-COMPLEX SCLEROSING LESION   BREAST EXCISIONAL BIOPSY Right 05/31/2019   RESIDUAL COMPLEX SCLEROSING LESION   BREAST LUMPECTOMY WITH NEEDLE LOCALIZATION Right 05/31/2019   Procedure: Right BREAST LUMPECTOMY WITH NEEDLE LOCALIZATION;  Surgeon: Campbell Lerner, MD;  Location: [facility];  Service: General;  Laterality: Right;   CYSTOSCOPY Bilateral 02/14/2018   Procedure: CYSTOSCOPY;  Surgeon: Defrancesco, Prentice Docker, MD;  Location: [facility];  Service: Gynecology;  Laterality: Bilateral;   DILATION AND CURETTAGE OF UTERUS     HYSTEROSCOPY     LAPAROSCOPIC ASSISTED VAGINAL HYSTERECTOMY N/A 02/14/2018   Procedure: LAPAROSCOPIC ASSISTED VAGINAL HYSTERECTOMY;  Surgeon: Herold Harms, MD;  Location: [facility];  Service: Gynecology;  Laterality: N/A;   LAPAROSCOPIC BILATERAL SALPINGECTOMY Bilateral 02/14/2018   Procedure: LAPAROSCOPIC BILATERAL SALPINGECTOMY;  Surgeon: Herold Harms, MD;  Location: [facility];  Service: Gynecology;  Laterality: Bilateral;   LAPAROSCOPY     DRAINAGE AND EXCISION OF PARATUBAL CYST   LEEP  2002   LEEP N/A 03/16/2016   Procedure: LOOP ELECTROSURGICAL EXCISION PROCEDURE (LEEP);  Surgeon: Herold Harms, MD;  Location: [facility];  Service: Gynecology;  Laterality: N/A;   NOVASURE ABLATION  2016   TONSILLECTOMY      Social History  Socioeconomic History   Marital status: Married    Spouse name: Not on file   Number of children: Not on file   Years of education: Not on file   Highest education level: Not on file  Occupational History   Not on file  Tobacco Use   Smoking status: Never   Smokeless tobacco: Never  Vaping Use   Vaping status: Never Used  Substance and Sexual Activity   Alcohol use: Yes    Comment: OCCAS   Drug use: No   Sexual activity: Not Currently    Birth control/protection: Surgical  Other  Topics Concern   Not on file  Social History Narrative   Not on file   Social Drivers of Health   Financial Resource Strain: Not on file  Food Insecurity: Not on file  Transportation Needs: Not on file  Physical Activity: Not on file  Stress: Not on file  Social Connections: Not on file  Intimate Partner Violence: Not on file    Current Outpatient Medications on File Prior to Visit  Medication Sig Dispense Refill   Multiple Vitamin (MULTIVITAMIN) capsule Take 1 capsule by mouth daily.     Semaglutide, 2 MG/DOSE, (OZEMPIC, 2 MG/DOSE,) 8 MG/3ML SOPN INJECT 2 MG INTO THE SKIN ONCE A WEEK. 3 mL 1   sertraline (ZOLOFT) 50 MG tablet TAKE 1 TABLET BY MOUTH EVERY DAY 90 tablet 1   valACYclovir (VALTREX) 1000 MG tablet TAKE 1 TABLET (1,000 MG TOTAL) BY MOUTH TWICE A DAY AS NEEDED 180 tablet 1   No current facility-administered medications on file prior to visit.    Allergies  Allergen Reactions   Egg-Derived Products Anaphylaxis   Niacin Hives, Rash and Other (See Comments)    Whelps in a matter of seconds      Review of Systems ROS Review of Systems - General ROS: negative for - chills, fatigue, fever, hot flashes, night sweats, weight gain or weight loss Psychological ROS: negative for - anxiety, decreased libido, depression, mood swings, physical abuse or sexual abuse Ophthalmic ROS: negative for - blurry vision, eye pain or loss of vision ENT ROS: negative for - headaches, hearing change, visual changes or vocal changes Allergy and Immunology ROS: negative for - hives, itchy/watery eyes or seasonal allergies Hematological and Lymphatic ROS: negative for - bleeding problems, bruising, swollen lymph nodes or weight loss Endocrine ROS: negative for - galactorrhea, hair pattern changes, hot flashes, malaise/lethargy, mood swings, palpitations, polydipsia/polyuria, skin changes, temperature intolerance or unexpected weight changes Breast ROS: negative for - new or changing breast  lumps or nipple discharge Respiratory ROS: negative for - cough or shortness of breath Cardiovascular ROS: negative for - chest pain, irregular heartbeat, palpitations or shortness of breath Gastrointestinal ROS: no abdominal pain, change in bowel habits, or black or bloody stools Genito-Urinary ROS: no dysuria, trouble voiding, or hematuria Musculoskeletal ROS: negative for - joint pain or joint stiffness Neurological ROS: negative for - bowel and bladder control changes Dermatological ROS: negative for rash and skin lesion changes   Objective:   BP (!) 101/54   Pulse 75   Resp 16   Ht 5\' 2"  (1.575 m)   Wt 154 lb 14.4 oz (70.3 kg)   LMP  (LMP Unknown)   BMI 28.33 kg/m  CONSTITUTIONAL: Well-developed, well-nourished female in no acute distress.  PSYCHIATRIC: Normal mood and affect. Normal behavior. Normal judgment and thought content. NEUROLGIC: Alert and oriented to person, place, and time. Normal muscle tone coordination. No cranial nerve deficit  noted. HENT:  Normocephalic, atraumatic, External right and left ear normal. Oropharynx is clear and moist EYES: Conjunctivae and EOM are normal. Pupils are equal, round, and reactive to light. No scleral icterus.  NECK: Normal range of motion, supple, no masses.  Normal thyroid.  SKIN: Skin is warm and dry. No rash noted. Not diaphoretic. No erythema. No pallor. CARDIOVASCULAR: Normal heart rate noted, regular rhythm, no murmur. RESPIRATORY: Clear to auscultation bilaterally. Effort and breath sounds normal, no problems with respiration noted. BREASTS: Symmetric in size. No masses, skin changes, nipple drainage, or lymphadenopathy. ABDOMEN: Soft, normal bowel sounds, no distention noted.  No tenderness, rebound or guarding.  BLADDER: Normal PELVIC:  Bladder no bladder distension noted  Urethra: normal appearing urethra with no masses, tenderness or lesions  Vulva: normal appearing vulva with no masses, tenderness or lesions  Vagina:  normal appearing vagina with normal color and discharge, no lesions. Grade 1 rectocele, minimal cystocele noted.   Cervix: surgically absent  Uterus: surgically absent, vaginal cuff well healed  Adnexa: normal right adnexa in size, nontender and no masses and surgically absent left  RV: External Exam NormaI, No Rectal Masses, and Normal Sphincter tone  MUSCULOSKELETAL: Normal range of motion. No tenderness.  No cyanosis, clubbing, or edema.  2+ distal pulses. LYMPHATIC: No Axillary, Supraclavicular, or Inguinal Adenopathy.   Labs: Lab Results  Component Value Date   WBC 6.0 12/25/2022   HGB 13.2 12/25/2022   HCT 39.7 12/25/2022   MCV 87.3 12/25/2022   PLT 257 12/25/2022    Lab Results  Component Value Date   CREATININE 0.59 12/25/2022   BUN 15 12/25/2022   NA 141 12/25/2022   K 4.3 12/25/2022   CL 107 12/25/2022   CO2 25 12/25/2022    Lab Results  Component Value Date   ALT 13 12/25/2022   AST 16 12/25/2022   ALKPHOS 72 10/12/2018   BILITOT 0.3 12/25/2022    Lab Results  Component Value Date   CHOL 177 12/25/2022   HDL 49 (L) 12/25/2022   LDLCALC 96 12/25/2022   TRIG 218 (H) 12/25/2022   CHOLHDL 3.6 12/25/2022    Lab Results  Component Value Date   TSH 2.18 01/30/2021    Lab Results  Component Value Date   HGBA1C 5.7 (H) 12/25/2022     Assessment:   1. Encounter for well woman exam with routine gynecological exam   2. Prediabetes   3. Pure hypertriglyceridemia   4. Overweight with body mass index (BMI) of 26 to 26.9 in adult   5. Stress incontinence in female      Plan:  - Pap: Not needed. Patient is s/p hysterectomy, also with h/o low grade dysplasia prior to surgery.  - Mammogram:  Up to date. Due in December.  - Colon Screening:   Up to date. Due in 2027.  - Labs: Performed by PCP. Reviewed.  - Routine preventative health maintenance measures emphasized: Exercise/Diet/Weight control and Stress Management. Encouraged intake of daily vitamin or  calcium/Vitamin D supplementation.  - Stress incontinence - Minimal prolapse noted. Discussed management options. Patient prefers referral to PT, order placed.  - Return to Clinic - 1 Year   Hildred Laser, MD [facility]

## 2023-06-30 ENCOUNTER — Encounter: Payer: Self-pay | Admitting: Obstetrics and Gynecology

## 2023-06-30 ENCOUNTER — Ambulatory Visit (INDEPENDENT_AMBULATORY_CARE_PROVIDER_SITE_OTHER): Admitting: Obstetrics and Gynecology

## 2023-06-30 VITALS — BP 101/54 | HR 75 | Resp 16 | Ht 62.0 in | Wt 154.9 lb

## 2023-06-30 DIAGNOSIS — R7303 Prediabetes: Secondary | ICD-10-CM

## 2023-06-30 DIAGNOSIS — N393 Stress incontinence (female) (male): Secondary | ICD-10-CM

## 2023-06-30 DIAGNOSIS — Z01419 Encounter for gynecological examination (general) (routine) without abnormal findings: Secondary | ICD-10-CM | POA: Diagnosis not present

## 2023-06-30 DIAGNOSIS — E781 Pure hyperglyceridemia: Secondary | ICD-10-CM

## 2023-06-30 DIAGNOSIS — Z6826 Body mass index (BMI) 26.0-26.9, adult: Secondary | ICD-10-CM

## 2023-07-22 ENCOUNTER — Encounter: Payer: Self-pay | Admitting: Internal Medicine

## 2023-07-22 ENCOUNTER — Telehealth: Admitting: Internal Medicine

## 2023-07-22 ENCOUNTER — Ambulatory Visit (LOCAL_COMMUNITY_HEALTH_CENTER): Payer: Self-pay

## 2023-07-22 ENCOUNTER — Ambulatory Visit: Admitting: Internal Medicine

## 2023-07-22 DIAGNOSIS — F411 Generalized anxiety disorder: Secondary | ICD-10-CM | POA: Diagnosis not present

## 2023-07-22 DIAGNOSIS — R41840 Attention and concentration deficit: Secondary | ICD-10-CM

## 2023-07-22 DIAGNOSIS — Z111 Encounter for screening for respiratory tuberculosis: Secondary | ICD-10-CM

## 2023-07-22 MED ORDER — GUANFACINE HCL ER 1 MG PO TB24: 1.0000 mg | ORAL_TABLET | Freq: Every day | ORAL | 0 refills | Status: DC

## 2023-07-22 NOTE — Patient Instructions (Signed)

## 2023-07-22 NOTE — Progress Notes (Signed)
 In nurse clinic for QFT as needed for school/ healthcare related.  ROI signed to release results to patient and husband Yaa Donnellan). Plans to view results on MyChart. RN explained that [facility] will contact her with QFT results when available and can provide a paper result copy if needed that can be picked up anytime Monday through Friday from 8 am to 5 pm. Patient will notify [facility] if needs a result copy. Questions answered and reports understanding. RN walked patient to lab. Zyona Pettaway, RN

## 2023-07-22 NOTE — Progress Notes (Signed)
 HVirtual Visit via Video Note  I connected with Tracy Madden on 07/22/23 at  2:20 PM EDT by a video enabled telemedicine application and verified that I am speaking with the correct person using two identifiers.  Location: Patient: Work Provider: Engineer, structural in this video call: Helayne Lo, NP-C and Caidyn Blossom   I discussed the limitations of evaluation and management by telemedicine and the availability of in person appointments. The patient expressed understanding and agreed to proceed.  History of Present Illness:   Discussed the use of AI scribe software for clinical note transcription with the patient, who gave verbal consent to proceed.  Tracy Madden "[name]" is a 53 year old female who presents with symptoms suggestive of ADHD, including difficulty focusing and sleep disturbances.  She has been experiencing symptoms she attributes to ADHD, which have become increasingly overwhelming. She struggles with schoolwork, has difficulty catching up with homework, and performs poorly on tests despite extensive studying. During tests, she is unable to recall material, which she finds frustrating.  These symptoms have been present for a long time, but she was previously able to compensate. However, with age and hormonal changes, she is now truly struggling. She has at least another year of school remaining and finds the workload, including clinicals and lectures, to be too much.  She has not been formally diagnosed with ADHD but has always struggled in school, requiring extra time to process information. She finds it difficult to concentrate when there are too many people around and prefers to study when it is quiet and free of distractions.  She has started hormonal therapy, which has helped her achieve a better balance and improved her sleep. However, she is sensitive to stimulants, noting that even coffee or chocolate can disrupt her sleep, sometimes  requiring up to 30 mg of melatonin to help her sleep.  She is looking for temporary assistance to help her focus while she is in school, as her usual compensatory strategies are no longer effective.      Past Medical History:  Diagnosis Date   Abdominal pain, LLQ    Abnormal uterine bleeding    Anemia    Cervical dysplasia    Endometriosis    Fibroid    Melanoma (HCC)    RT SHOULDER   Paratubal cyst    Pneumonia    Prediabetes     Current Outpatient Medications  Medication Sig Dispense Refill   Multiple Vitamin (MULTIVITAMIN) capsule Take 1 capsule by mouth daily.     Semaglutide , 2 MG/DOSE, (OZEMPIC , 2 MG/DOSE,) 8 MG/3ML SOPN INJECT 2 MG INTO THE SKIN ONCE A WEEK. 3 mL 1   sertraline  (ZOLOFT ) 50 MG tablet TAKE 1 TABLET BY MOUTH EVERY DAY 90 tablet 1   valACYclovir  (VALTREX ) 1000 MG tablet TAKE 1 TABLET (1,000 MG TOTAL) BY MOUTH TWICE A DAY AS NEEDED 180 tablet 1   No current facility-administered medications for this visit.    Allergies  Allergen Reactions   Egg-Derived Products Anaphylaxis   Niacin Hives, Rash and Other (See Comments)    Whelps in a matter of seconds    Family History  Problem Relation Age of Onset   Uterine cancer Mother     Social History   Socioeconomic History   Marital status: Married    Spouse name: Not on file   Number of children: Not on file   Years of education: Not on file   Highest education level: Not on file  Occupational History   Not on file  Tobacco Use   Smoking status: Never   Smokeless tobacco: Never  Vaping Use   Vaping status: Never Used  Substance and Sexual Activity   Alcohol use: Yes    Comment: OCCAS   Drug use: No   Sexual activity: Not Currently    Birth control/protection: Surgical  Other Topics Concern   Not on file  Social History Narrative   Not on file   Social Drivers of Health   Financial Resource Strain: Not on file  Food Insecurity: Not on file  Transportation Needs: Not on file  Physical  Activity: Not on file  Stress: Not on file  Social Connections: Not on file  Intimate Partner Violence: Not on file     Constitutional: Denies fever, malaise, fatigue, headache or abrupt weight changes.  HEENT: Denies eye pain, eye redness, ear pain, ringing in the ears, wax buildup, runny nose, nasal congestion, bloody nose, or sore throat. Respiratory: Denies difficulty breathing, shortness of breath, cough or sputum production.   Cardiovascular: Denies chest pain, chest tightness, palpitations or swelling in the hands or feet.  Gastrointestinal: Denies abdominal pain, bloating, constipation, diarrhea or blood in the stool.  GU: Denies urgency, frequency, pain with urination, burning sensation, blood in urine, odor or discharge. Musculoskeletal: Denies decrease in range of motion, difficulty with gait, muscle pain or joint pain and swelling.  Skin: Denies redness, rashes, lesions or ulcercations.  Neurological: Pt reports insomnia, inattention. Denies dizziness, difficulty with memory, difficulty with speech or problems with balance and coordination.  Psych: Pt has a history of anxiety. Denies depression, SI/HI.  No other specific complaints in a complete review of systems (except as listed in HPI above).  Observations/Objective:   Wt Readings from Last 3 Encounters:  06/30/23 154 lb 14.4 oz (70.3 kg)  12/25/22 147 lb (66.7 kg)  08/04/22 144 lb (65.3 kg)    General: Appears her stated age, well developed, well nourished in NAD. Pulmonary/Chest: Normal effort. No respiratory distress.  Neurological: Alert and oriented.  Psychiatric: Mood and affect normal. Mildly anxious appearing. Judgment and thought content normal.    BMET    Component Value Date/Time   NA 141 12/25/2022 1346   NA 143 10/12/2018 1519   K 4.3 12/25/2022 1346   CL 107 12/25/2022 1346   CO2 25 12/25/2022 1346   GLUCOSE 93 12/25/2022 1346   BUN 15 12/25/2022 1346   BUN 13 10/12/2018 1519   CREATININE  0.59 12/25/2022 1346   CALCIUM 9.5 12/25/2022 1346   GFRNONAA 85 10/12/2018 1519   GFRAA 98 10/12/2018 1519    Lipid Panel     Component Value Date/Time   CHOL 177 12/25/2022 1346   CHOL 182 10/12/2018 1519   TRIG 218 (H) 12/25/2022 1346   HDL 49 (L) 12/25/2022 1346   HDL 41 10/12/2018 1519   CHOLHDL 3.6 12/25/2022 1346   LDLCALC 96 12/25/2022 1346    CBC    Component Value Date/Time   WBC 6.0 12/25/2022 1346   RBC 4.55 12/25/2022 1346   HGB 13.2 12/25/2022 1346   HGB 13.4 10/12/2018 1519   HCT 39.7 12/25/2022 1346   HCT 39.6 10/12/2018 1519   PLT 257 12/25/2022 1346   PLT 315 10/12/2018 1519   MCV 87.3 12/25/2022 1346   MCV 85 10/12/2018 1519   MCH 29.0 12/25/2022 1346   MCHC 33.2 12/25/2022 1346   RDW 12.5 12/25/2022 1346   RDW 12.7 10/12/2018 1519  LYMPHSABS 1.8 03/12/2016 1041   MONOABS 0.4 03/12/2016 1041   EOSABS 0.1 03/12/2016 1041   BASOSABS 0.0 03/12/2016 1041    Hgb A1C Lab Results  Component Value Date   HGBA1C 5.7 (H) 12/25/2022       Assessment and Plan:  Assessment and Plan    ADHD Symptoms consistent with ADHD, exacerbated by hormonal changes and academic demands. Prefers non-stimulant treatment due to anxiety and stimulant sensitivity. - Prescribed Intuniv  1 mg daily in the morning. - Advised to avoid taking medication later in the day to prevent sleep disturbances. - Follow-up in one month to assess effectiveness and adjust dosage.  Anxiety Anxiety potentially worsened by stimulants. Current hormonal therapy has improved sleep and balance. - Avoid stimulant medications.       Schedule an appt for followup chronic conditions  Follow Up Instructions:    I discussed the assessment and treatment plan with the patient. The patient was provided an opportunity to ask questions and all were answered. The patient agreed with the plan and demonstrated an understanding of the instructions.   The patient was advised to call back or seek  an in-person evaluation if the symptoms worsen or if the condition fails to improve as anticipated.   Helayne Lo, NP

## 2023-07-27 LAB — QUANTIFERON-TB GOLD PLUS
QuantiFERON Mitogen Value: >10 [IU]/mL
QuantiFERON Nil Value: 0.08 [IU]/mL
QuantiFERON TB1 Ag Value: 0.07 [IU]/mL
QuantiFERON TB2 Ag Value: 0.08 [IU]/mL
QuantiFERON-TB Gold Plus: NEGATIVE

## 2023-07-27 NOTE — Progress Notes (Signed)
 QFT results negative. Phone call to patient. No answer and message left that results are available for pick up at [facility] info booth Monday through Friday between 8 am and 5pm. May call [facility] nurse clinic with questions [phone]. Makailyn Mccormick, RN

## 2023-07-29 ENCOUNTER — Encounter: Payer: Self-pay | Admitting: Internal Medicine

## 2023-07-29 ENCOUNTER — Telehealth (INDEPENDENT_AMBULATORY_CARE_PROVIDER_SITE_OTHER): Admitting: Internal Medicine

## 2023-07-29 ENCOUNTER — Telehealth: Payer: Self-pay

## 2023-07-29 DIAGNOSIS — R41840 Attention and concentration deficit: Secondary | ICD-10-CM

## 2023-07-29 MED ORDER — ATOMOXETINE HCL 10 MG PO CAPS: 10.0000 mg | ORAL_CAPSULE | Freq: Every day | ORAL | 0 refills | Status: DC

## 2023-07-29 NOTE — Progress Notes (Signed)
 HVirtual Visit via Video Note  I connected with Tracy Madden on 07/29/23 at  9:00 AM EDT by a video enabled telemedicine application and verified that I am speaking with the correct person using two identifiers.  Location: Patient: Work Provider: Engineer, structural in this video call: Helayne Lo, NP-C and Maciah Koster   I discussed the limitations of evaluation and management by telemedicine and the availability of in person appointments. The patient expressed understanding and agreed to proceed.  History of Present Illness:      Discussed the use of AI scribe software for clinical note transcription with the patient, who gave verbal consent to proceed.  Tracy Madden "[name]" is a 53 year old female who presents with follow-up for Entin.  She experiences significant drowsiness approximately 30 to 40 minutes after taking Intuniv , even at a reduced dose of 0.5 mg. Initially, she tried the full dose of 1 mg but reduced it due to her sensitivity to medications. Despite the reduced dose, the drowsiness persists.  While the medication helps her feel calmer and less distracted, the drowsiness is problematic. She is currently sleep-deprived, so the drowsiness is somewhat beneficial, but she is concerned about its impact on her daily activities, particularly her focus and attention for school.  She has not tried taking the medication at night and is unsure if the effects would last until the morning.       Past Medical History:  Diagnosis Date   Abdominal pain, LLQ    Abnormal uterine bleeding    Anemia    Cervical dysplasia    Endometriosis    Fibroid    Melanoma (HCC)    RT SHOULDER   Paratubal cyst    Pneumonia    Prediabetes     Current Outpatient Medications  Medication Sig Dispense Refill   guanFACINE  (INTUNIV ) 1 MG TB24 ER tablet Take 1 tablet (1 mg total) by mouth daily. 30 tablet 0   Multiple Vitamin (MULTIVITAMIN) capsule Take 1 capsule by  mouth daily.     Semaglutide , 2 MG/DOSE, (OZEMPIC , 2 MG/DOSE,) 8 MG/3ML SOPN INJECT 2 MG INTO THE SKIN ONCE A WEEK. 3 mL 1   sertraline  (ZOLOFT ) 50 MG tablet TAKE 1 TABLET BY MOUTH EVERY DAY 90 tablet 1   valACYclovir  (VALTREX ) 1000 MG tablet TAKE 1 TABLET (1,000 MG TOTAL) BY MOUTH TWICE A DAY AS NEEDED 180 tablet 1   No current facility-administered medications for this visit.    Allergies  Allergen Reactions   Egg-Derived Products Anaphylaxis   Niacin Hives, Rash and Other (See Comments)    Whelps in a matter of seconds    Family History  Problem Relation Age of Onset   Uterine cancer Mother     Social History   Socioeconomic History   Marital status: Married    Spouse name: Not on file   Number of children: Not on file   Years of education: Not on file   Highest education level: Not on file  Occupational History   Not on file  Tobacco Use   Smoking status: Never   Smokeless tobacco: Never  Vaping Use   Vaping status: Never Used  Substance and Sexual Activity   Alcohol use: Yes    Comment: OCCAS   Drug use: No   Sexual activity: Not Currently    Birth control/protection: Surgical  Other Topics Concern   Not on file  Social History Narrative   Not on file   Social Drivers  of Health   Financial Resource Strain: Not on file  Food Insecurity: Not on file  Transportation Needs: Not on file  Physical Activity: Not on file  Stress: Not on file  Social Connections: Not on file  Intimate Partner Violence: Not on file     Constitutional: Denies fever, malaise, fatigue, headache or abrupt weight changes.  HEENT: Denies eye pain, eye redness, ear pain, ringing in the ears, wax buildup, runny nose, nasal congestion, bloody nose, or sore throat. Respiratory: Denies difficulty breathing, shortness of breath, cough or sputum production.   Cardiovascular: Denies chest pain, chest tightness, palpitations or swelling in the hands or feet.  Gastrointestinal: Denies  abdominal pain, bloating, constipation, diarrhea or blood in the stool.  GU: Denies urgency, frequency, pain with urination, burning sensation, blood in urine, odor or discharge. Musculoskeletal: Denies decrease in range of motion, difficulty with gait, muscle pain or joint pain and swelling.  Skin: Denies redness, rashes, lesions or ulcercations.  Neurological: Pt reports insomnia, inattention. Denies dizziness, difficulty with memory, difficulty with speech or problems with balance and coordination.  Psych: Pt has a history of anxiety. Denies depression, SI/HI.  No other specific complaints in a complete review of systems (except as listed in HPI above).  Observations/Objective:   Wt Readings from Last 3 Encounters:  06/30/23 154 lb 14.4 oz (70.3 kg)  12/25/22 147 lb (66.7 kg)  08/04/22 144 lb (65.3 kg)    General: Appears her stated age, well developed, well nourished in NAD. Pulmonary/Chest: Normal effort. No respiratory distress.  Neurological: Alert and oriented.  Psychiatric: Mood and affect normal. Mildly anxious appearing. Judgment and thought content normal.    BMET    Component Value Date/Time   NA 141 12/25/2022 1346   NA 143 10/12/2018 1519   K 4.3 12/25/2022 1346   CL 107 12/25/2022 1346   CO2 25 12/25/2022 1346   GLUCOSE 93 12/25/2022 1346   BUN 15 12/25/2022 1346   BUN 13 10/12/2018 1519   CREATININE 0.59 12/25/2022 1346   CALCIUM 9.5 12/25/2022 1346   GFRNONAA 85 10/12/2018 1519   GFRAA 98 10/12/2018 1519    Lipid Panel     Component Value Date/Time   CHOL 177 12/25/2022 1346   CHOL 182 10/12/2018 1519   TRIG 218 (H) 12/25/2022 1346   HDL 49 (L) 12/25/2022 1346   HDL 41 10/12/2018 1519   CHOLHDL 3.6 12/25/2022 1346   LDLCALC 96 12/25/2022 1346    CBC    Component Value Date/Time   WBC 6.0 12/25/2022 1346   RBC 4.55 12/25/2022 1346   HGB 13.2 12/25/2022 1346   HGB 13.4 10/12/2018 1519   HCT 39.7 12/25/2022 1346   HCT 39.6 10/12/2018 1519    PLT 257 12/25/2022 1346   PLT 315 10/12/2018 1519   MCV 87.3 12/25/2022 1346   MCV 85 10/12/2018 1519   MCH 29.0 12/25/2022 1346   MCHC 33.2 12/25/2022 1346   RDW 12.5 12/25/2022 1346   RDW 12.7 10/12/2018 1519   LYMPHSABS 1.8 03/12/2016 1041   MONOABS 0.4 03/12/2016 1041   EOSABS 0.1 03/12/2016 1041   BASOSABS 0.0 03/12/2016 1041    Hgb A1C Lab Results  Component Value Date   HGBA1C 5.7 (H) 12/25/2022       Assessment and Plan:  Assessment and Plan    Inattention Intuniv  improves focus but causes significant drowsiness.  - Advised taking Intuniv  1 mg at bedtime to reduce drowsiness. -If drowsiness persists, will discontinue Intuniv  and  start TriCare 10 mg daily    Schedule an appt for followup chronic conditions  Follow Up Instructions:    I discussed the assessment and treatment plan with the patient. The patient was provided an opportunity to ask questions and all were answered. The patient agreed with the plan and demonstrated an understanding of the instructions.   The patient was advised to call back or seek an in-person evaluation if the symptoms worsen or if the condition fails to improve as anticipated.   Helayne Lo, NP

## 2023-07-29 NOTE — Patient Instructions (Signed)
 Living With Attention Deficit Hyperactivity Disorder If you have been diagnosed with attention deficit hyperactivity disorder (ADHD), you may be relieved that you now know why you have felt or behaved a certain way. Still, you may feel overwhelmed about the treatment ahead. You may also wonder how to get the support you need and how to deal with the condition day-to-day. With treatment and support, you can live with ADHD and manage your symptoms. How to manage lifestyle changes Managing lifestyle changes can be challenging. Seeking support from your healthcare provider, therapist, family, and friends can be helpful. How to recognize changes in your condition The following signs may mean that your treatment is working well and your condition is improving: Consistently being on time for appointments. Being more organized at home and work. Other people noticing improvements in your behavior. Achieving goals that you set for yourself. Thinking more clearly. The following signs may mean that your treatment is not working very well: Feeling impatience or more confusion. Missing, forgetting, or being late for appointments. An increasing sense of disorganization and messiness. More difficulty in reaching goals that you set for yourself. Loved ones becoming angry or frustrated with you. Follow these instructions at home: Medicines Take over-the-counter and prescription medicines only as told by your health care provider. Check with your health care provider before taking any new medicines. General instructions Create structure and an organized atmosphere at home. For example: Make a list of tasks, then rank them from most important to least important. Work on one task at a time until your listed tasks are done. Make a daily schedule and follow it consistently every day. Use an appointment calendar, and check it 2-3 times a day to keep on track. Keep it with you when you leave the house. Create  spaces where you keep certain things, and always put things back in their places after you use them. Keep all follow-up visits. Your health care provider will need to monitor your condition and adjust your treatment over time. Where to find support Talking to others  Keep emotion out of important discussions and speak in a calm, logical way. Listen closely and patiently to your loved ones. Try to understand their point of view, and try to avoid getting defensive. Take responsibility for the consequences of your actions. Ask that others do not take your behaviors personally. Aim to solve problems as they come up, and express your feelings instead of bottling them up. Talk openly about what you need from your loved ones and how they can support you. Consider going to family therapy sessions or having your family meet with a specialist who deals with ADHD-related behavior problems. Finances Not all insurance plans cover mental health care, so it is important to check with your insurance carrier. If paying for co-pays or counseling services is a problem, search for a local or county mental health care center. Public mental health care services may be offered there at a low cost or no cost when you are not able to see a private health care provider. If you are taking medicine for ADHD, you may be able to get the generic form, which may be less expensive than brand-name medicine. Some makers of prescription medicines also offer help to patients who cannot afford the medicines that they need. Therapy and support groups Talking with a mental health care provider and participating in support groups can help to improve your quality of life, daily functioning, and overall symptoms. Questions to ask your health  care provider: What are the risks and benefits of taking medicines? Would I benefit from therapy? How often should I follow up with a health care provider? Where to find more information Learn more  about ADHD from: Children and Adults with Attention Deficit Hyperactivity Disorder: [employer]add[employer] of Mental Health: BloggerCourse.com Centers for Disease Control and Prevention: TonerPromos.no Contact a health care provider if: You have side effects from your medicines, such as: Repeated muscle twitches, coughing, or speech outbursts. Sleep problems. Loss of appetite. Dizziness. Unusually fast heartbeat. Stomach pains. Headaches. You have new or worsening behavior problems. You are struggling with anxiety, depression, or substance abuse. Get help right away if: You have a severe reaction to a medicine. These symptoms may be an emergency. Get help right away. Call 911. Do not wait to see if the symptoms will go away. Do not drive yourself to the hospital. Take one of these steps if you feel like you may hurt yourself or others, or have thoughts about taking your own life: Go to your nearest emergency room. Call 911. Call the National Suicide Prevention Lifeline at (989) 115-0802 or 988. This is open 24 hours a day. Text the Crisis Text Line at 802-008-3988. Summary With treatment and support, you can live with ADHD and manage your symptoms. Consider taking part in family therapy or self-help groups with family members or friends. When you talk with friends and family about your ADHD, be patient and communicate openly. Keep all follow-up visits. Your health care provider will need to monitor your condition and adjust your treatment over time. This information is not intended to replace advice given to you by your health care provider. Make sure you discuss any questions you have with your health care provider. Document Revised: 07/04/2021 Document Reviewed: 07/04/2021 Elsevier Patient Education  2024 [employer].

## 2023-07-29 NOTE — Telephone Encounter (Signed)
 Phone call to patient to notify her of negative QFT results. No answer. Message left to contact [facility] for QFT results and paper result copy is available for pick up at [facility] Monday through Friday between 8 am and 5 pm. If questions, may call nurse clinic at [phone]. Stephfon Bovey, RN

## 2023-08-14 ENCOUNTER — Other Ambulatory Visit: Payer: Self-pay | Admitting: Internal Medicine

## 2023-08-17 NOTE — Telephone Encounter (Signed)
 Requested medication (s) are due for refill today - unsure  Requested medication (s) are on the active medication list yes  Future visit scheduled -no  Last refill: 07/22/23 #30  Notes to clinic: new start medication- 90 day Rx request sent for review   Requested Prescriptions  Pending Prescriptions Disp Refills   guanFACINE  (INTUNIV ) 1 MG TB24 ER tablet [Pharmacy Med Name: GUANFACINE  HCL ER 1 MG TABLET] 90 tablet 1    Sig: TAKE 1 TABLET BY MOUTH EVERY DAY     Cardiovascular: Alpha-2 Agonists - guanfacine  Failed - 08/17/2023 11:33 AM      Failed - Valid encounter within last 6 months    Recent Outpatient Visits           2 weeks ago Inattention   [facility] [place], [place] Buzzard, NP   3 weeks ago Inattention   [facility] [place], [place] W, NP              Passed - Last BP in normal range    BP Readings from Last 1 Encounters:  06/30/23 (!) 101/54         Passed - Last Heart Rate in normal range    Pulse Readings from Last 1 Encounters:  06/30/23 75            Requested Prescriptions  Pending Prescriptions Disp Refills   guanFACINE  (INTUNIV ) 1 MG TB24 ER tablet [Pharmacy Med Name: GUANFACINE  HCL ER 1 MG TABLET] 90 tablet 1    Sig: TAKE 1 TABLET BY MOUTH EVERY DAY     Cardiovascular: Alpha-2 Agonists - guanfacine  Failed - 08/17/2023 11:33 AM      Failed - Valid encounter within last 6 months    Recent Outpatient Visits           2 weeks ago Inattention   [facility] [place], Rankin Buzzard, NP   3 weeks ago Inattention   [facility] [place], [place] W, NP              Passed - Last BP in normal range    BP Readings from Last 1 Encounters:  06/30/23 (!) 101/54         Passed - Last Heart Rate in normal range    Pulse Readings from Last 1 Encounters:  06/30/23 75

## 2023-08-20 ENCOUNTER — Other Ambulatory Visit: Payer: Self-pay | Admitting: Internal Medicine

## 2023-08-24 NOTE — Telephone Encounter (Signed)
 Requested medication (s) are due for refill today: yes  Requested medication (s) are on the active medication list: yes  Last refill:  07/29/23  Future visit scheduled: no  Notes to clinic:     Unable to refill per protocol, cannot delegate.  Requested Prescriptions  Pending Prescriptions Disp Refills   atomoxetine  (STRATTERA ) 10 MG capsule [Pharmacy Med Name: ATOMOXETINE  HCL 10 MG CAPSULE] 90 capsule 1    Sig: TAKE 1 CAPSULE BY MOUTH EVERY DAY     Not Delegated - Psychiatry: Norepinephrine Reuptake Inhibitor Failed - 08/24/2023 12:29 PM      Failed - This refill cannot be delegated      Failed - Valid encounter within last 6 months    Recent Outpatient Visits           3 weeks ago Inattention   [facility] [place], Rankin Buzzard, NP   1 month ago Inattention   [facility] [place], [place], [place]              Passed - Completed PHQ-2 or PHQ-9 in the last 360 days      Passed - Last BP in normal range    BP Readings from Last 1 Encounters:  06/30/23 (!) 101/54         Passed - Last Heart Rate in normal range    Pulse Readings from Last 1 Encounters:  06/30/23 75

## 2023-10-18 ENCOUNTER — Encounter: Payer: Self-pay | Admitting: Internal Medicine

## 2023-10-18 ENCOUNTER — Telehealth (INDEPENDENT_AMBULATORY_CARE_PROVIDER_SITE_OTHER): Admitting: Internal Medicine

## 2023-10-18 VITALS — Wt 152.0 lb

## 2023-10-18 DIAGNOSIS — F32A Depression, unspecified: Secondary | ICD-10-CM | POA: Diagnosis not present

## 2023-10-18 DIAGNOSIS — R41840 Attention and concentration deficit: Secondary | ICD-10-CM | POA: Insufficient documentation

## 2023-10-18 DIAGNOSIS — F419 Anxiety disorder, unspecified: Secondary | ICD-10-CM

## 2023-10-18 DIAGNOSIS — F909 Attention-deficit hyperactivity disorder, unspecified type: Secondary | ICD-10-CM | POA: Diagnosis not present

## 2023-10-18 NOTE — Patient Instructions (Signed)
 Depression Screening Depression screening is a tool that your health care provider can use to learn if you have symptoms of depression. Depression is a common condition with many symptoms that are also often found in other conditions. Depression is treatable, but it must first be diagnosed. You may not know that certain feelings, thoughts, and behaviors that you are having can be symptoms of depression. Taking a depression screening test can help you and your health care provider decide if you need more assessment, or if you should be referred to a mental health care provider. What are the screening tests? You may have a physical exam to see if another condition is affecting your mental health. You may have a blood or urine sample taken during the physical exam. You may be interviewed or offered a written test using a screening tool that was developed from research, such as one of these: Patient Health Questionnaire (PHQ). This is a set of either 2 or 9 questions. A health care provider who has been trained to score this screening test uses a guide to assess if your symptoms suggest that you may have depression. Hamilton Depression Rating Scale (HAM-D). This is a set of either 17 or 24 questions. You may be asked to take it again during or after your treatment, to see if your depression has gotten better. Beck Depression Inventory (BDI). This is a set of 21 multiple choice questions. Your health care provider scores your answers to assess: Your level of depression, ranging from mild to severe. Your response to treatment. Your health care provider may talk with you about your daily activities, such as eating, sleeping, work, and recreation, and ask if you have had any changes in activity. Your health care provider may ask you to see a mental health specialist, such as a psychiatrist or psychologist, for more evaluation. Who should be screened for depression?  All adults, including adults with a family  history of a mental health disorder. People who are 46-33 years old. People who are recovering from an acute condition, such as myocardial infarction (MI) or stroke. Pregnant women, or women who have given birth. People who have a long-term (chronic) illness. Anyone who has been diagnosed with another type of mental health disorder. Anyone who has symptoms that could show depression. What do my results mean? Your health care provider will review the results of your depression screening, physical exam, and lab tests. Positive screens suggest that you may have depression. Screening is the first step in getting the care that you may need. It will be important for you to know the results of your tests. Ask your health care provider, or the department that is doing your screening tests, when your results will be ready. Talk with your health care provider about your results, diagnosis, and recommendations for follow-up. A diagnosis of depression is made using information from the Diagnostic and Statistical Manual of Mental Disorders (DSM-5). This is a book that lists the number and type of symptoms that must be present for a health care provider to give a specific diagnosis. Your health care provider may work with you to treat your symptoms of depression, or your health care provider may help you find a mental health provider who can assess and help you develop a plan to treat your depression. Get help right away if: You have thoughts about hurting yourself or others. If you ever feel like you may hurt yourself or others, or have thoughts about taking your own life,  get help right away. Go to your nearest emergency department or: Call your local emergency services (911 in the [place] Call a suicide crisis helpline, such as the National Suicide Prevention Lifeline at 6577826223 or 988 in the [place] This is open 24 hours a day in the [place] If you're a Veteran: Call 988 and press 1. This is open 24 hours a  day. Text the [employer] at 865-878-8368. Summary Depression screening is the first step in getting the help that you may need. If your screening test shows symptoms of depression (is positive), your health care provider may ask you to see a mental health provider who will help identify ways to treat your depression. Anyone aged 34 or older should be screened for depression. This information is not intended to replace advice given to you by your health care provider. Make sure you discuss any questions you have with your health care provider. Document Revised: 02/16/2023 Document Reviewed: 06/24/2020 Elsevier Patient Education  2024 [employer].

## 2023-10-18 NOTE — Progress Notes (Signed)
 Virtual Visit via Video Note  I connected with Tracy Madden on 10/18/23 at  8:40 AM EDT by a video enabled telemedicine application and verified that I am speaking with the correct person using two identifiers.  Location: Patient: Home Provider: Office  Person's participating in this video call: [name], NP-C and Lisvet Rasheed   I discussed the limitations of evaluation and management by telemedicine and the availability of in person appointments. The patient expressed understanding and agreed to proceed.  History of Present Illness:   Discussed the use of AI scribe software for clinical note transcription with the patient, who gave verbal consent to proceed.  Tracy Madden is a 53 year old female with ADHD, anxiety and depression who presents with concerns about her current medication regimen.  She feels slightly depressed and wishes to switch back to her previous ADHD medication, Intuniv  1 mg, as she believes it is more effective than her current medication, Strattera  10 mg.   Regarding her depression, she is currently taking Sertraline  50 mg but has been using it on an as-needed basis. She experiences episodes of crying and irritability over the past two weekends, along with difficulty sleeping.  She describes significant job stress, stating that dealing with new staff every couple of months is overwhelming.  She is also currently in school but she does not feel like this is what is overwhelming her.  She feels like she is 'falling apart' but is determined to 'paddle until I graduate in May.'      Past Medical History:  Diagnosis Date   Abdominal pain, LLQ    Abnormal uterine bleeding    Anemia    Cervical dysplasia    Endometriosis    Fibroid    Melanoma (HCC)    RT SHOULDER   Paratubal cyst    Pneumonia    Prediabetes     Current Outpatient Medications  Medication Sig Dispense Refill   atomoxetine  (STRATTERA ) 10 MG capsule TAKE 1 CAPSULE BY  MOUTH EVERY DAY 90 capsule 1   guanFACINE  (INTUNIV ) 1 MG TB24 ER tablet TAKE 1 TABLET BY MOUTH EVERY DAY 90 tablet 1   Multiple Vitamin (MULTIVITAMIN) capsule Take 1 capsule by mouth daily.     Semaglutide , 2 MG/DOSE, (OZEMPIC , 2 MG/DOSE,) 8 MG/3ML SOPN INJECT 2 MG INTO THE SKIN ONCE A WEEK. 3 mL 1   sertraline  (ZOLOFT ) 50 MG tablet TAKE 1 TABLET BY MOUTH EVERY DAY 90 tablet 1   valACYclovir  (VALTREX ) 1000 MG tablet TAKE 1 TABLET (1,000 MG TOTAL) BY MOUTH TWICE A DAY AS NEEDED 180 tablet 1   No current facility-administered medications for this visit.    Allergies  Allergen Reactions   Egg-Derived Products Anaphylaxis   Niacin Hives, Rash and Other (See Comments)    Whelps in a matter of seconds    Family History  Problem Relation Age of Onset   Uterine cancer Mother     Social History   Socioeconomic History   Marital status: Married    Spouse name: Not on file   Number of children: Not on file   Years of education: Not on file   Highest education level: Not on file  Occupational History   Not on file  Tobacco Use   Smoking status: Never   Smokeless tobacco: Never  Vaping Use   Vaping status: Never Used  Substance and Sexual Activity   Alcohol use: Yes    Comment: OCCAS   Drug use: No  Sexual activity: Not Currently    Birth control/protection: Surgical  Other Topics Concern   Not on file  Social History Narrative   Not on file   Social Drivers of Health   Financial Resource Strain: Not on file  Food Insecurity: Not on file  Transportation Needs: Not on file  Physical Activity: Not on file  Stress: Not on file  Social Connections: Not on file  Intimate Partner Violence: Not on file     Constitutional: Denies fever, malaise, fatigue, headache or abrupt weight changes.  Respiratory: Denies difficulty breathing, shortness of breath, cough or sputum production.   Cardiovascular: Denies chest pain, chest tightness, palpitations or swelling in the hands or  feet.  Neurological: Patient reports inattention, insomnia.  Denies dizziness, difficulty with memory, difficulty with speech or problems with balance and coordination.  Psych: Patient has a history of anxiety, reports depression.  Denies SI/HI.  No other specific complaints in a complete review of systems (except as listed in HPI above).  Observations/Objective:   Wt Readings from Last 3 Encounters:  06/30/23 154 lb 14.4 oz (70.3 kg)  12/25/22 147 lb (66.7 kg)  08/04/22 144 lb (65.3 kg)    General: Appears her stated age, well developed, well nourished in NAD. Pulmonary/Chest: Normal effort. No respiratory distress.  Neurological: Alert and oriented.  Psychiatric: Mood and affect mildly flat. Behavior is normal. Judgment and thought content normal.    BMET    Component Value Date/Time   NA 141 12/25/2022 1346   NA 143 10/12/2018 1519   K 4.3 12/25/2022 1346   CL 107 12/25/2022 1346   CO2 25 12/25/2022 1346   GLUCOSE 93 12/25/2022 1346   BUN 15 12/25/2022 1346   BUN 13 10/12/2018 1519   CREATININE 0.59 12/25/2022 1346   CALCIUM 9.5 12/25/2022 1346   GFRNONAA 85 10/12/2018 1519   GFRAA 98 10/12/2018 1519    Lipid Panel     Component Value Date/Time   CHOL 177 12/25/2022 1346   CHOL 182 10/12/2018 1519   TRIG 218 (H) 12/25/2022 1346   HDL 49 (L) 12/25/2022 1346   HDL 41 10/12/2018 1519   CHOLHDL 3.6 12/25/2022 1346   LDLCALC 96 12/25/2022 1346    CBC    Component Value Date/Time   WBC 6.0 12/25/2022 1346   RBC 4.55 12/25/2022 1346   HGB 13.2 12/25/2022 1346   HGB 13.4 10/12/2018 1519   HCT 39.7 12/25/2022 1346   HCT 39.6 10/12/2018 1519   PLT 257 12/25/2022 1346   PLT 315 10/12/2018 1519   MCV 87.3 12/25/2022 1346   MCV 85 10/12/2018 1519   MCH 29.0 12/25/2022 1346   MCHC 33.2 12/25/2022 1346   RDW 12.5 12/25/2022 1346   RDW 12.7 10/12/2018 1519   LYMPHSABS 1.8 03/12/2016 1041   MONOABS 0.4 03/12/2016 1041   EOSABS 0.1 03/12/2016 1041   BASOSABS 0.0  03/12/2016 1041    Hgb A1C Lab Results  Component Value Date   HGBA1C 5.7 (H) 12/25/2022       Assessment and Plan:  Assessment and Plan    Anxiety and Depression Inconsistent sertraline  use leading to ineffective symptom control. Emphasized consistent dosing for therapeutic effect. Discussed potential dose increase if symptoms persist. - Advise consistent daily use of sertraline  50 mg for 4-6 weeks. - Evaluate the need for dose adjustment of sertraline  after 4 weeks of consistent use.  Attention-Deficit/Hyperactivity Disorder (ADHD) Prefers Intuniv  over Strattera  for better symptom control. Discontinued Strattera  based on preference  and effectiveness of Intuniv . - Continue Intuniv  1 mg for ADHD management. - Discontinue Strattera  10 mg .   RTC in 2 months for your annual exam  Follow Up Instructions:    I discussed the assessment and treatment plan with the patient. The patient was provided an opportunity to ask questions and all were answered. The patient agreed with the plan and demonstrated an understanding of the instructions.   The patient was advised to call back or seek an in-person evaluation if the symptoms worsen or if the condition fails to improve as anticipated.   [name], NP

## 2023-10-21 ENCOUNTER — Ambulatory Visit: Admitting: Internal Medicine

## 2023-11-26 ENCOUNTER — Other Ambulatory Visit: Payer: Self-pay | Admitting: Internal Medicine

## 2023-11-29 NOTE — Telephone Encounter (Signed)
 OV 10/18/23- continue Requested Prescriptions  Pending Prescriptions Disp Refills   sertraline  (ZOLOFT ) 50 MG tablet [Pharmacy Med Name: SERTRALINE  HCL 50 MG TABLET] 90 tablet 1    Sig: TAKE 1 TABLET BY MOUTH EVERY DAY     Psychiatry:  Antidepressants - SSRI - sertraline  Passed - 11/29/2023  6:17 AM      Passed - AST in normal range and within 360 days    AST  Date Value Ref Range Status  12/25/2022 16 10 - 35 U/L Final         Passed - ALT in normal range and within 360 days    ALT  Date Value Ref Range Status  12/25/2022 13 6 - 29 U/L Final         Passed - Completed PHQ-2 or PHQ-9 in the last 360 days      Passed - Valid encounter within last 6 months    Recent Outpatient Visits           1 month ago Attention or concentration deficit   [place] [facility] [place], Angeline ORN, NP   4 months ago Inattention   [place] [facility] [place], Angeline ORN, NP   4 months ago Inattention   [place] [facility] [place], Angeline ORN, [place]

## 2024-02-11 ENCOUNTER — Ambulatory Visit (INDEPENDENT_AMBULATORY_CARE_PROVIDER_SITE_OTHER): Admitting: Internal Medicine

## 2024-02-11 ENCOUNTER — Encounter: Payer: Self-pay | Admitting: Internal Medicine

## 2024-02-11 VITALS — BP 104/68 | Ht 62.0 in | Wt 159.8 lb

## 2024-02-11 DIAGNOSIS — Z0001 Encounter for general adult medical examination with abnormal findings: Secondary | ICD-10-CM

## 2024-02-11 DIAGNOSIS — Z1231 Encounter for screening mammogram for malignant neoplasm of breast: Secondary | ICD-10-CM | POA: Diagnosis not present

## 2024-02-11 DIAGNOSIS — R7303 Prediabetes: Secondary | ICD-10-CM

## 2024-02-11 DIAGNOSIS — E663 Overweight: Secondary | ICD-10-CM

## 2024-02-11 DIAGNOSIS — Z6829 Body mass index (BMI) 29.0-29.9, adult: Secondary | ICD-10-CM

## 2024-02-11 DIAGNOSIS — E781 Pure hyperglyceridemia: Secondary | ICD-10-CM | POA: Diagnosis not present

## 2024-02-11 MED ORDER — OZEMPIC (2 MG/DOSE) 8 MG/3ML ~~LOC~~ SOPN: 2.0000 mg | PEN_INJECTOR | SUBCUTANEOUS | 1 refills | Status: AC

## 2024-02-11 MED ORDER — GUANFACINE HCL ER 1 MG PO TB24: 1.0000 mg | ORAL_TABLET | Freq: Every day | ORAL | 1 refills | Status: AC

## 2024-02-11 MED ORDER — SERTRALINE HCL 50 MG PO TABS: 50.0000 mg | ORAL_TABLET | Freq: Every day | ORAL | 1 refills | Status: AC

## 2024-02-11 NOTE — Patient Instructions (Signed)
 Health Maintenance for Postmenopausal Women Menopause is a normal process in which your ability to get pregnant comes to an end. This process happens slowly over many months or years, usually between the ages of 76 and 38. Menopause is complete when you have missed your menstrual period for 12 months. It is important to talk with your health care provider about some of the most common conditions that affect women after menopause (postmenopausal women). These include heart disease, cancer, and bone loss (osteoporosis). Adopting a healthy lifestyle and getting preventive care can help to promote your health and wellness. The actions you take can also lower your chances of developing some of these common conditions. What are the signs and symptoms of menopause? During menopause, you may have the following symptoms: Hot flashes. These can be moderate or severe. Night sweats. Decrease in sex drive. Mood swings. Headaches. Tiredness (fatigue). Irritability. Memory problems. Problems falling asleep or staying asleep. Talk with your health care provider about treatment options for your symptoms. Do I need hormone replacement therapy? Hormone replacement therapy is effective in treating symptoms that are caused by menopause, such as hot flashes and night sweats. Hormone replacement carries certain risks, especially as you become older. If you are thinking about using estrogen or estrogen with progestin, discuss the benefits and risks with your health care provider. How can I reduce my risk for heart disease and stroke? The risk of heart disease, heart attack, and stroke increases as you age. One of the causes may be a change in the body's hormones during menopause. This can affect how your body uses dietary fats, triglycerides, and cholesterol. Heart attack and stroke are medical emergencies. There are many things that you can do to help prevent heart disease and stroke. Watch your blood pressure High  blood pressure causes heart disease and increases the risk of stroke. This is more likely to develop in people who have high blood pressure readings or are overweight. Have your blood pressure checked: Every 3-5 years if you are 32-23 years of age. Every year if you are 31 years old or older. Eat a healthy diet  Eat a diet that includes plenty of vegetables, fruits, low-fat dairy products, and lean protein. Do not eat a lot of foods that are high in solid fats, added sugars, or sodium. Get regular exercise Get regular exercise. This is one of the most important things you can do for your health. Most adults should: Try to exercise for at least 150 minutes each week. The exercise should increase your heart rate and make you sweat (moderate-intensity exercise). Try to do strengthening exercises at least twice each week. Do these in addition to the moderate-intensity exercise. Spend less time sitting. Even light physical activity can be beneficial. Other tips Work with your health care provider to achieve or maintain a healthy weight. Do not use any products that contain nicotine or tobacco. These products include cigarettes, chewing tobacco, and vaping devices, such as e-cigarettes. If you need help quitting, ask your health care provider. Know your numbers. Ask your health care provider to check your cholesterol and your blood sugar (glucose). Continue to have your blood tested as directed by your health care provider. Do I need screening for cancer? Depending on your health history and family history, you may need to have cancer screenings at different stages of your life. This may include screening for: Breast cancer. Cervical cancer. Lung cancer. Colorectal cancer. What is my risk for osteoporosis? After menopause, you may be  at increased risk for osteoporosis. Osteoporosis is a condition in which bone destruction happens more quickly than new bone creation. To help prevent osteoporosis or  the bone fractures that can happen because of osteoporosis, you may take the following actions: If you are 24-54 years old, get at least 1,000 mg of calcium and at least 600 international units (IU) of vitamin D  per day. If you are older than age 75 but younger than age 30, get at least 1,200 mg of calcium and at least 600 international units (IU) of vitamin D  per day. If you are older than age 8, get at least 1,200 mg of calcium and at least 800 international units (IU) of vitamin D  per day. Smoking and drinking excessive alcohol increase the risk of osteoporosis. Eat foods that are rich in calcium and vitamin D , and do weight-bearing exercises several times each week as directed by your health care provider. How does menopause affect my mental health? Depression may occur at any age, but it is more common as you become older. Common symptoms of depression include: Feeling depressed. Changes in sleep patterns. Changes in appetite or eating patterns. Feeling an overall lack of motivation or enjoyment of activities that you previously enjoyed. Frequent crying spells. Talk with your health care provider if you think that you are experiencing any of these symptoms. General instructions See your health care provider for regular wellness exams and vaccines. This may include: Scheduling regular health, dental, and eye exams. Getting and maintaining your vaccines. These include: Influenza vaccine. Get this vaccine each year before the flu season begins. Pneumonia vaccine. Shingles vaccine. Tetanus, diphtheria, and pertussis (Tdap) booster vaccine. Your health care provider may also recommend other immunizations. Tell your health care provider if you have ever been abused or do not feel safe at home. Summary Menopause is a normal process in which your ability to get pregnant comes to an end. This condition causes hot flashes, night sweats, decreased interest in sex, mood swings, headaches, or lack  of sleep. Treatment for this condition may include hormone replacement therapy. Take actions to keep yourself healthy, including exercising regularly, eating a healthy diet, watching your weight, and checking your blood pressure and blood sugar levels. Get screened for cancer and depression. Make sure that you are up to date with all your vaccines. This information is not intended to replace advice given to you by your health care provider. Make sure you discuss any questions you have with your health care provider. Document Revised: 08/05/2020 Document Reviewed: 08/05/2020 Elsevier Patient Education  2024 [employer].

## 2024-02-11 NOTE — Progress Notes (Signed)
 Subjective:    Patient ID: Tracy Madden, female    DOB: 05/15/1970, 53 y.o.   MRN: [id number]  HPI  Patient presents to clinic today for her annual exam.  Flu: 11/2023 Tetanus: 12/2022 COVID: x 2 Shingrix: never Pap smear: Hysterectomy Mammogram: 02/2023 Colon screening: 06/2015 Vision screening: annually Dentist: biannually  Diet: She does eat meat. She consumes fruits and veggies. She does eat some fried foods. She driks mostly water. Exercise: Walking  Review of Systems     Past Medical History:  Diagnosis Date   Abdominal pain, LLQ    Abnormal uterine bleeding    Anemia    Cervical dysplasia    Endometriosis    Fibroid    Melanoma (HCC)    RT SHOULDER   Paratubal cyst    Pneumonia    Prediabetes     Current Outpatient Medications  Medication Sig Dispense Refill   guanFACINE  (INTUNIV ) 1 MG TB24 ER tablet TAKE 1 TABLET BY MOUTH EVERY DAY 90 tablet 1   Semaglutide , 2 MG/DOSE, (OZEMPIC , 2 MG/DOSE,) 8 MG/3ML SOPN INJECT 2 MG INTO THE SKIN ONCE A WEEK. 3 mL 1   sertraline  (ZOLOFT ) 50 MG tablet TAKE 1 TABLET BY MOUTH EVERY DAY 90 tablet 0   valACYclovir  (VALTREX ) 1000 MG tablet TAKE 1 TABLET (1,000 MG TOTAL) BY MOUTH TWICE A DAY AS NEEDED 180 tablet 1   No current facility-administered medications for this visit.    Allergies  Allergen Reactions   Egg Protein-Containing Drug Products Anaphylaxis   Niacin Hives, Rash and Other (See Comments)    Whelps in a matter of seconds    Family History  Problem Relation Age of Onset   Uterine cancer Mother     Social History   Socioeconomic History   Marital status: Married    Spouse name: Not on file   Number of children: Not on file   Years of education: Not on file   Highest education level: Not on file  Occupational History   Not on file  Tobacco Use   Smoking status: Never   Smokeless tobacco: Never  Vaping Use   Vaping status: Never Used  Substance and Sexual Activity   Alcohol use: Yes     Comment: OCCAS   Drug use: No   Sexual activity: Not Currently    Birth control/protection: Surgical  Other Topics Concern   Not on file  Social History Narrative   Not on file   Social Drivers of Health   Financial Resource Strain: Not on file  Food Insecurity: Not on file  Transportation Needs: Not on file  Physical Activity: Not on file  Stress: Not on file  Social Connections: Not on file  Intimate Partner Violence: Not on file     Constitutional: Denies fever, malaise, fatigue, headache or abrupt weight changes.  HEENT: Denies eye pain, eye redness, ear pain, ringing in the ears, wax buildup, runny nose, nasal congestion, bloody nose, or sore throat. Respiratory: Denies difficulty breathing, shortness of breath, cough or sputum production.   Cardiovascular: Denies chest pain, chest tightness, palpitations or swelling in the hands or feet.  Gastrointestinal: Denies abdominal pain, bloating, constipation, diarrhea or blood in the stool.  GU: Denies urgency, frequency, pain with urination, burning sensation, blood in urine, odor or discharge. Musculoskeletal: Denies decrease in range of motion, difficulty with gait, muscle pain or joint pain and swelling.  Skin: Denies redness, rashes, lesions or ulcercations.  Neurological: Patient reports inattention.  Denies dizziness,  difficulty with memory, difficulty with speech or problems with balance and coordination.  Psych: Patient has a history of anxiety and depression.  Denies SI/HI.  No other specific complaints in a complete review of systems (except as listed in HPI above).  Objective:   Physical Exam  BP 104/68 (BP Location: Left Arm, Patient Position: Sitting, Cuff Size: Normal)   Ht 5' 2 (1.575 m)   Wt 159 lb 12.8 oz (72.5 kg)   LMP  (LMP Unknown)   BMI 29.23 kg/m    Wt Readings from Last 3 Encounters:  10/18/23 152 lb (68.9 kg)  06/30/23 154 lb 14.4 oz (70.3 kg)  12/25/22 147 lb (66.7 kg)    General: Appears  her stated age, overweight, in NAD. Skin: Warm, dry and intact.  HEENT: Head: normal shape and size; Eyes: sclera white, no icterus, conjunctiva pink, PERRLA and EOMs intact;  Neck:  Neck supple, trachea midline. No masses, lumps or thyromegaly present.  Cardiovascular: Normal rate and rhythm. S1,S2 noted.  No murmur, rubs or gallops noted. No JVD or BLE edema. No carotid bruits noted. Pulmonary/Chest: Normal effort and positive vesicular breath sounds. No respiratory distress. No wheezes, rales or ronchi noted.  Abdomen: Soft and nontender. Normal bowel sounds.  Musculoskeletal: Strength 5/5 BUE/BLE.  No difficulty with gait.  Neurological: Alert and oriented. Cranial nerves II-XII grossly intact. Coordination normal.  Psychiatric: Mood and affect normal.  Mildly anxious appearing. Judgment and thought content normal.    BMET    Component Value Date/Time   NA 141 12/25/2022 1346   NA 143 10/12/2018 1519   K 4.3 12/25/2022 1346   CL 107 12/25/2022 1346   CO2 25 12/25/2022 1346   GLUCOSE 93 12/25/2022 1346   BUN 15 12/25/2022 1346   BUN 13 10/12/2018 1519   CREATININE 0.59 12/25/2022 1346   CALCIUM 9.5 12/25/2022 1346   GFRNONAA 85 10/12/2018 1519   GFRAA 98 10/12/2018 1519    Lipid Panel     Component Value Date/Time   CHOL 177 12/25/2022 1346   CHOL 182 10/12/2018 1519   TRIG 218 (H) 12/25/2022 1346   HDL 49 (L) 12/25/2022 1346   HDL 41 10/12/2018 1519   CHOLHDL 3.6 12/25/2022 1346   LDLCALC 96 12/25/2022 1346    CBC    Component Value Date/Time   WBC 6.0 12/25/2022 1346   RBC 4.55 12/25/2022 1346   HGB 13.2 12/25/2022 1346   HGB 13.4 10/12/2018 1519   HCT 39.7 12/25/2022 1346   HCT 39.6 10/12/2018 1519   PLT 257 12/25/2022 1346   PLT 315 10/12/2018 1519   MCV 87.3 12/25/2022 1346   MCV 85 10/12/2018 1519   MCH 29.0 12/25/2022 1346   MCHC 33.2 12/25/2022 1346   RDW 12.5 12/25/2022 1346   RDW 12.7 10/12/2018 1519   LYMPHSABS 1.8 03/12/2016 1041   MONOABS 0.4  03/12/2016 1041   EOSABS 0.1 03/12/2016 1041   BASOSABS 0.0 03/12/2016 1041    Hgb A1C Lab Results  Component Value Date   HGBA1C 5.7 (H) 12/25/2022           Assessment & Plan:   Preventative health maintenance:  Flu shot UTD Tetanus UTD Encouraged her to get her COVID booster Discussed Shingrix vaccine, she will check coverage with her insurance company and schedule visit if she would like to have this done She no longer needs to screen for cervical cancer Mammogram ordered-she will call to schedule Colon screening UTD Encouraged her to consume  a balanced diet and exercise regimen Advised her to see an eye doctor and dentist annually Will check CBC, c-Met, lipid, A1c today  RTC in 6 months, follow-up chronic conditions [name], NP

## 2024-02-11 NOTE — Assessment & Plan Note (Signed)
 Encouraged diet and exercise for weight loss ?

## 2024-02-12 LAB — COMPREHENSIVE METABOLIC PANEL WITH GFR
AG Ratio: 2.1 (calc) (ref 1.0–2.5)
ALT: 14 U/L (ref 6–29)
AST: 14 U/L (ref 10–35)
Albumin: 4.9 g/dL (ref 3.6–5.1)
Alkaline phosphatase (APISO): 83 U/L (ref 37–153)
BUN: 16 mg/dL (ref 7–25)
CO2: 28 mmol/L (ref 20–32)
Calcium: 10.4 mg/dL (ref 8.6–10.4)
Chloride: 105 mmol/L (ref 98–110)
Creat: 0.69 mg/dL (ref 0.50–1.03)
Globulin: 2.3 g/dL (ref 1.9–3.7)
Glucose, Bld: 93 mg/dL (ref 65–99)
Potassium: 3.9 mmol/L (ref 3.5–5.3)
Sodium: 141 mmol/L (ref 135–146)
Total Bilirubin: 0.3 mg/dL (ref 0.2–1.2)
Total Protein: 7.2 g/dL (ref 6.1–8.1)
eGFR: 104 mL/min/1.73m2 (ref >=60)

## 2024-02-12 LAB — CBC
HCT: 43.5 % (ref 35.0–45.0)
Hemoglobin: 14.4 g/dL (ref 11.7–15.5)
MCH: 28.7 pg (ref 27.0–33.0)
MCHC: 33.1 g/dL (ref 32.0–36.0)
MCV: 86.7 fL (ref 80.0–100.0)
MPV: 10.6 fL (ref 7.5–12.5)
Platelets: 332 Thousand/uL (ref 140–400)
RBC: 5.02 Million/uL (ref 3.80–5.10)
RDW: 12.9 % (ref 11.0–15.0)
WBC: 7 Thousand/uL (ref 3.8–10.8)

## 2024-02-12 LAB — HEMOGLOBIN A1C
Hgb A1c MFr Bld: 5.5 % (ref <5.7)
Mean Plasma Glucose: 111 mg/dL
eAG (mmol/L): 6.2 mmol/L

## 2024-02-12 LAB — LIPID PANEL
Cholesterol: 222 mg/dL — ABNORMAL HIGH (ref <200)
HDL: 53 mg/dL (ref >=50)
LDL Cholesterol (Calc): 137 mg/dL — ABNORMAL HIGH
Non-HDL Cholesterol (Calc): 169 mg/dL — ABNORMAL HIGH (ref <130)
Total CHOL/HDL Ratio: 4.2 (calc) (ref <5.0)
Triglycerides: 186 mg/dL — ABNORMAL HIGH (ref <150)

## 2024-02-14 ENCOUNTER — Ambulatory Visit: Payer: Self-pay | Admitting: Internal Medicine

## 2024-03-14 NOTE — Progress Notes (Unsigned)
 Subjective:    Patient ID: Tracy Madden, female    DOB: 1971/02/03, 53 y.o.   MRN: [id number]  History of Present Illness:  Discussed the use of AI scribe software for clinical note transcription with the patient, who gave verbal consent to proceed.  Tracy Madden is a 53 year old female who presents with symptoms of a viral upper respiratory infection and green nasal discharge.  She has been experiencing malaise and chills since Sunday, prompting a visit to urgent care. Her symptoms were initially considered viral, and she tested negative for both COVID-19 and influenza on Monday.  Currently, she has developed a green discharge from the left side of her nose and a sore throat on the same side. She also experiences a cough that results in urinary incontinence, stating, 'I'm coughing, I peeing on myself.'  She began taking leftover Augmentin  on her own, taking two doses, which she feels has improved her voice. She did not receive any treatment at urgent care aside from the recommendation for symptomatic management.      Past Medical History:  Diagnosis Date   Abdominal pain, LLQ    Abnormal uterine bleeding    Anemia    Cervical dysplasia    Endometriosis    Fibroid    Melanoma (HCC)    RT SHOULDER   Paratubal cyst    Pneumonia    Prediabetes     Current Outpatient Medications  Medication Sig Dispense Refill   guanFACINE  (INTUNIV ) 1 MG TB24 ER tablet Take 1 tablet (1 mg total) by mouth daily. 90 tablet 1   Semaglutide , 2 MG/DOSE, (OZEMPIC , 2 MG/DOSE,) 8 MG/3ML SOPN Inject 2 mg into the skin once a week. 3 mL 1   sertraline  (ZOLOFT ) 50 MG tablet Take 1 tablet (50 mg total) by mouth daily. 90 tablet 1   valACYclovir  (VALTREX ) 1000 MG tablet TAKE 1 TABLET (1,000 MG TOTAL) BY MOUTH TWICE A DAY AS NEEDED (Patient taking differently: as needed.) 180 tablet 1   No current facility-administered medications for this visit.    Allergies[1]  Family History   Problem Relation Age of Onset   Uterine cancer Mother     Social History   Socioeconomic History   Marital status: Married    Spouse name: Not on file   Number of children: Not on file   Years of education: Not on file   Highest education level: Not on file  Occupational History   Not on file  Tobacco Use   Smoking status: Never   Smokeless tobacco: Never  Vaping Use   Vaping status: Never Used  Substance and Sexual Activity   Alcohol use: Yes    Comment: OCCAS   Drug use: No   Sexual activity: Not Currently    Birth control/protection: Surgical  Other Topics Concern   Not on file  Social History Narrative   Not on file   Social Drivers of Health   Tobacco Use: Low Risk (02/11/2024)   Patient History    Smoking Tobacco Use: Never    Smokeless Tobacco Use: Never    Passive Exposure: Not on file  Financial Resource Strain: Not on file  Food Insecurity: Not on file  Transportation Needs: Not on file  Physical Activity: Not on file  Stress: Not on file  Social Connections: Not on file  Intimate Partner Violence: Not on file  Depression (PHQ2-9): High Risk (02/11/2024)   Depression (PHQ2-9)    PHQ-2 Score: 24  Alcohol Screen: Low Risk (08/04/2022)   Alcohol Screen    Last Alcohol Screening Score (AUDIT): 5  Housing: Not on file  Utilities: Not on file  Health Literacy: Not on file     Constitutional: Pt reports malaise, chills. Denies fever, headache or abrupt weight changes.  HEENT: Patient reports nasal congestion and sore throat.  Denies eye pain, eye redness, ear pain, ringing in the ears, wax buildup, runny nose, bloody nose. Respiratory: Patient reports cough.  Denies difficulty breathing, shortness of breath, or sputum production.   Cardiovascular: Denies chest pain, chest tightness, palpitations or swelling in the hands or feet.  Gastrointestinal: Denies abdominal pain, bloating, constipation, diarrhea or blood in the stool.  GU: Denies urgency,  frequency, pain with urination, burning sensation, blood in urine, odor or discharge. Musculoskeletal: Denies decrease in range of motion, difficulty with gait, muscle pain or joint pain and swelling.  Neurological: Denies dizziness, difficulty with memory, difficulty with speech or problems with balance and coordination.    No other specific complaints in a complete review of systems (except as listed in HPI above).  Observations/Objective:   BP 108/70 (BP Location: Left Arm, Patient Position: Sitting, Cuff Size: Normal)   Pulse 84   Temp 97.9 F (36.6 C)   Ht 5' 2 (1.575 m)   Wt 158 lb 12.8 oz (72 kg)   LMP  (LMP Unknown)   SpO2 97%   BMI 29.04 kg/m   Wt Readings from Last 3 Encounters:  02/11/24 159 lb 12.8 oz (72.5 kg)  10/18/23 152 lb (68.9 kg)  06/30/23 154 lb 14.4 oz (70.3 kg)    General: Appears her stated age, appears unwell but in NAD. Skin: Warm, dry and intact.  HEENT: Head: normal shape and size, left maxillary sinus tenderness noted; Eyes: sclera white, no icterus, conjunctiva pink, PERRLA and EOMs intact; Ears: Tm's gray and intact, normal light reflex; Nose: mucosa boggy and moist, turbinates swollen; Throat/Mouth: Teeth present, mucosa pink and moist, no exudate, lesions or ulcerations noted.  Neck: No adenopathy noted. Cardiovascular: Normal rate and rhythm. S1,S2 noted.  No murmur, rubs or gallops noted.  Pulmonary/Chest: Normal effort and positive vesicular breath sounds. No respiratory distress. No wheezes, rales or ronchi noted.  Neurological: Alert and oriented.   BMET    Component Value Date/Time   NA 141 02/11/2024 1515   NA 143 10/12/2018 1519   K 3.9 02/11/2024 1515   CL 105 02/11/2024 1515   CO2 28 02/11/2024 1515   GLUCOSE 93 02/11/2024 1515   BUN 16 02/11/2024 1515   BUN 13 10/12/2018 1519   CREATININE 0.69 02/11/2024 1515   CALCIUM 10.4 02/11/2024 1515   GFRNONAA 85 10/12/2018 1519   GFRAA 98 10/12/2018 1519    Lipid Panel      Component Value Date/Time   CHOL 222 (H) 02/11/2024 1515   CHOL 182 10/12/2018 1519   TRIG 186 (H) 02/11/2024 1515   HDL 53 02/11/2024 1515   HDL 41 10/12/2018 1519   CHOLHDL 4.2 02/11/2024 1515   LDLCALC 137 (H) 02/11/2024 1515    CBC    Component Value Date/Time   WBC 7.0 02/11/2024 1515   RBC 5.02 02/11/2024 1515   HGB 14.4 02/11/2024 1515   HGB 13.4 10/12/2018 1519   HCT 43.5 02/11/2024 1515   HCT 39.6 10/12/2018 1519   PLT 332 02/11/2024 1515   PLT 315 10/12/2018 1519   MCV 86.7 02/11/2024 1515   MCV 85 10/12/2018 1519   MCH  28.7 02/11/2024 1515   MCHC 33.1 02/11/2024 1515   RDW 12.9 02/11/2024 1515   RDW 12.7 10/12/2018 1519   LYMPHSABS 1.8 03/12/2016 1041   MONOABS 0.4 03/12/2016 1041   EOSABS 0.1 03/12/2016 1041   BASOSABS 0.0 03/12/2016 1041    Hgb A1C Lab Results  Component Value Date   HGBA1C 5.5 02/11/2024         Assessment and Plan:    Assessment and Plan    Acute sinusitis Symptoms suggest viral infection with secondary bacterial sinusitis. Negative COVID and flu tests. Improvement noted with Augmentin . - Continue Augmentin  875-125 mg for 10 days. - Prescribe Tessalon 100 mg 3 times daily as needed for cough - Encouraged rest and fluids - Can use a Nettie pot which can be purchased from your local pharmacy - Provided work note for return on Wednesday.       RTC in 5 months for follow-up of chronic conditions    [name], NP     [1]  Allergies Allergen Reactions   Egg Protein-Containing Drug Products Anaphylaxis   Niacin Hives, Rash and Other (See Comments)    [place]ps in a matter of seconds

## 2024-03-15 ENCOUNTER — Ambulatory Visit: Admitting: Internal Medicine

## 2024-03-15 ENCOUNTER — Encounter: Payer: Self-pay | Admitting: Internal Medicine

## 2024-03-15 VITALS — BP 108/70 | HR 84 | Temp 97.9°F | Ht 62.0 in | Wt 158.8 lb

## 2024-03-15 DIAGNOSIS — B9689 Other specified bacterial agents as the cause of diseases classified elsewhere: Secondary | ICD-10-CM

## 2024-03-15 DIAGNOSIS — J019 Acute sinusitis, unspecified: Secondary | ICD-10-CM

## 2024-03-15 MED ORDER — AMOXICILLIN-POT CLAVULANATE 875-125 MG PO TABS: 1.0000 | ORAL_TABLET | Freq: Two times a day (BID) | ORAL | 0 refills | Status: AC

## 2024-03-15 MED ORDER — BENZONATATE 100 MG PO CAPS: 100.0000 mg | ORAL_CAPSULE | Freq: Three times a day (TID) | ORAL | 0 refills | Status: DC | PRN

## 2024-03-15 NOTE — Patient Instructions (Signed)

## 2024-03-17 ENCOUNTER — Ambulatory Visit: Admitting: Internal Medicine

## 2024-05-01 ENCOUNTER — Ambulatory Visit
Admission: EM | Admit: 2024-05-01 | Discharge: 2024-05-01 | Disposition: A | Discharge status: Home / Self Care | Source: Home / Self Care

## 2024-05-01 DIAGNOSIS — J40 Bronchitis, not specified as acute or chronic: Secondary | ICD-10-CM | POA: Diagnosis not present

## 2024-05-01 DIAGNOSIS — J329 Chronic sinusitis, unspecified: Secondary | ICD-10-CM

## 2024-05-01 DIAGNOSIS — J209 Acute bronchitis, unspecified: Secondary | ICD-10-CM

## 2024-05-01 MED ORDER — AZELASTINE HCL 0.1 % NA SOLN: 1.0000 | Freq: Two times a day (BID) | NASAL | 0 refills | Status: AC

## 2024-05-01 MED ORDER — GUAIFENESIN ER 600 MG PO TB12: 600.0000 mg | ORAL_TABLET | Freq: Two times a day (BID) | ORAL | 0 refills | Status: AC

## 2024-05-01 MED ORDER — BENZONATATE 100 MG PO CAPS: 100.0000 mg | ORAL_CAPSULE | Freq: Three times a day (TID) | ORAL | 0 refills | Status: AC

## 2024-05-01 MED ORDER — PREDNISONE 50 MG PO TABS: ORAL_TABLET | ORAL | 0 refills | Status: AC

## 2024-05-01 NOTE — Discharge Instructions (Addendum)
 You have been diagnosed with bronchitis. I sent mucinex , tessalon , nasal spray, and short course of steroids. This is viral and an antibiotic is not needed. Please follow-up here or with PCP is symptoms do not improve.

## 2024-05-02 ENCOUNTER — Ambulatory Visit
Admission: RE | Admit: 2024-05-02 | Discharge: 2024-05-02 | Disposition: A | Source: Ambulatory Visit | Attending: Internal Medicine | Admitting: Internal Medicine

## 2024-05-02 ENCOUNTER — Encounter: Payer: Self-pay | Admitting: Internal Medicine

## 2024-05-02 ENCOUNTER — Telehealth: Admitting: Internal Medicine

## 2024-05-02 ENCOUNTER — Ambulatory Visit: Payer: Self-pay | Admitting: Internal Medicine

## 2024-05-02 DIAGNOSIS — J209 Acute bronchitis, unspecified: Secondary | ICD-10-CM | POA: Diagnosis not present

## 2024-05-02 DIAGNOSIS — N393 Stress incontinence (female) (male): Secondary | ICD-10-CM

## 2024-05-02 MED ORDER — AZITHROMYCIN 250 MG PO TABS: ORAL_TABLET | ORAL | 0 refills | Status: AC

## 2024-05-02 NOTE — Patient Instructions (Signed)
 Kegel Exercises  Kegel exercises can help strengthen your pelvic floor muscles. The pelvic floor is a group of muscles that support your rectum, small intestine, and bladder. In females, pelvic floor muscles also help support the uterus. These muscles help you control the flow of urine and stool (feces). Kegel exercises are painless and simple. They do not require any equipment. Your provider may suggest Kegel exercises to: Improve bladder and bowel control. Improve sexual response. Improve weak pelvic floor muscles after surgery to remove the uterus (hysterectomy) or after pregnancy, in females. Improve weak pelvic floor muscles after prostate gland removal or surgery, in males. Kegel exercises involve squeezing your pelvic floor muscles. These are the same muscles you squeeze when you try to stop the flow of urine or keep from passing gas. The exercises can be done while sitting, standing, or lying down, but it is best to vary your position. Ask your health care provider which exercises are safe for you. Do exercises exactly as told by your health care provider and adjust them as directed. Do not begin these exercises until told by your health care provider. Exercises How to do Kegel exercises: Squeeze your pelvic floor muscles tight. You should feel a tight lift in your rectal area. If you are a female, you should also feel a tightness in your vaginal area. Keep your stomach, buttocks, and legs relaxed. Hold the muscles tight for up to 10 seconds. Breathe normally. Relax your muscles for up to 10 seconds. Repeat as told by your health care provider. Repeat this exercise daily as told by your health care provider. Continue to do this exercise for at least 4-6 weeks, or for as long as told by your health care provider. You may be referred to a physical therapist who can help you learn more about how to do Kegel exercises. Depending on your condition, your health care provider may  recommend: Varying how long you squeeze your muscles. Doing several sets of exercises every day. Doing exercises for several weeks. Making Kegel exercises a part of your regular exercise routine. This information is not intended to replace advice given to you by your health care provider. Make sure you discuss any questions you have with your health care provider. Document Revised: 07/25/2020 Document Reviewed: 07/25/2020 Elsevier Patient Education  2025 [employer].

## 2024-07-31 ENCOUNTER — Ambulatory Visit: Admitting: Internal Medicine
